# Patient Record
Sex: Female | Born: 1990 | Race: Asian | Hispanic: No | Marital: Married | Smoking: Never smoker
Health system: Southern US, Community
[De-identification: ages and names within clinical notes are randomized; demographics above are authoritative.]

## PROBLEM LIST (undated history)

## (undated) DIAGNOSIS — F32A Depression, unspecified: Secondary | ICD-10-CM

## (undated) DIAGNOSIS — R519 Headache, unspecified: Secondary | ICD-10-CM

## (undated) DIAGNOSIS — F329 Major depressive disorder, single episode, unspecified: Secondary | ICD-10-CM

## (undated) DIAGNOSIS — R87629 Unspecified abnormal cytological findings in specimens from vagina: Secondary | ICD-10-CM

## (undated) DIAGNOSIS — F0781 Postconcussional syndrome: Secondary | ICD-10-CM

## (undated) DIAGNOSIS — G43909 Migraine, unspecified, not intractable, without status migrainosus: Secondary | ICD-10-CM

## (undated) DIAGNOSIS — R51 Headache: Secondary | ICD-10-CM

## (undated) DIAGNOSIS — F419 Anxiety disorder, unspecified: Secondary | ICD-10-CM

## (undated) HISTORY — DX: Headache: R51

## (undated) HISTORY — DX: Migraine, unspecified, not intractable, without status migrainosus: G43.909

## (undated) HISTORY — DX: Depression, unspecified: F32.A

## (undated) HISTORY — DX: Postconcussional syndrome: F07.81

## (undated) HISTORY — DX: Headache, unspecified: R51.9

## (undated) HISTORY — PX: NO PAST SURGERIES: SHX2092

## (undated) HISTORY — DX: Major depressive disorder, single episode, unspecified: F32.9

## (undated) HISTORY — DX: Anxiety disorder, unspecified: F41.9

---
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
## 2013-03-01 ENCOUNTER — Emergency Department (HOSPITAL_COMMUNITY)
Admission: EM | Admit: 2013-03-01 | Discharge: 2013-03-01 | Disposition: A | Discharge status: Home / Self Care | Payer: No Typology Code available for payment source | Attending: Emergency Medicine | Admitting: Emergency Medicine

## 2013-03-01 ENCOUNTER — Encounter (HOSPITAL_COMMUNITY): Payer: Self-pay | Admitting: Emergency Medicine

## 2013-03-01 DIAGNOSIS — S0990XA Unspecified injury of head, initial encounter: Secondary | ICD-10-CM | POA: Insufficient documentation

## 2013-03-01 DIAGNOSIS — Y92410 Unspecified street and highway as the place of occurrence of the external cause: Secondary | ICD-10-CM | POA: Insufficient documentation

## 2013-03-01 DIAGNOSIS — Y9389 Activity, other specified: Secondary | ICD-10-CM | POA: Insufficient documentation

## 2013-03-01 NOTE — ED Provider Notes (Signed)
Medical screening examination/treatment/procedure(s) were performed by non-physician practitioner and as supervising physician I was immediately available for consultation/collaboration.  Issachar Broady T Dalton Mille, MD 03/01/13 1330 
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
## 2013-03-04 ENCOUNTER — Emergency Department (HOSPITAL_COMMUNITY)
Admission: EM | Admit: 2013-03-04 | Discharge: 2013-03-04 | Disposition: A | Discharge status: Home / Self Care | Payer: No Typology Code available for payment source | Attending: Emergency Medicine | Admitting: Emergency Medicine

## 2013-03-04 ENCOUNTER — Encounter (HOSPITAL_COMMUNITY): Payer: Self-pay | Admitting: Emergency Medicine

## 2013-03-04 DIAGNOSIS — F0781 Postconcussional syndrome: Secondary | ICD-10-CM

## 2013-03-04 DIAGNOSIS — R51 Headache: Secondary | ICD-10-CM | POA: Insufficient documentation

## 2013-03-04 DIAGNOSIS — Z3202 Encounter for pregnancy test, result negative: Secondary | ICD-10-CM | POA: Insufficient documentation

## 2013-03-04 LAB — POCT PREGNANCY, URINE: Preg Test, Ur: NEGATIVE

## 2013-03-04 MED ORDER — KETOROLAC TROMETHAMINE 60 MG/2ML IM SOLN
30.0000 mg | Freq: Once | INTRAMUSCULAR | Status: AC
  Administered 2013-03-04: 30 mg via INTRAMUSCULAR
  Filled 2013-03-04: qty 2
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
## 2013-03-18 ENCOUNTER — Emergency Department (HOSPITAL_COMMUNITY)
Admission: EM | Admit: 2013-03-18 | Discharge: 2013-03-18 | Disposition: A | Discharge status: Home / Self Care | Payer: Self-pay | Attending: Emergency Medicine | Admitting: Emergency Medicine

## 2013-03-18 ENCOUNTER — Encounter (HOSPITAL_COMMUNITY): Payer: Self-pay | Admitting: Emergency Medicine

## 2013-03-18 DIAGNOSIS — M545 Low back pain, unspecified: Secondary | ICD-10-CM | POA: Insufficient documentation

## 2013-03-18 DIAGNOSIS — H538 Other visual disturbances: Secondary | ICD-10-CM | POA: Insufficient documentation

## 2013-03-18 DIAGNOSIS — G8911 Acute pain due to trauma: Secondary | ICD-10-CM | POA: Insufficient documentation

## 2013-03-18 DIAGNOSIS — R51 Headache: Secondary | ICD-10-CM | POA: Insufficient documentation

## 2013-03-18 DIAGNOSIS — R42 Dizziness and giddiness: Secondary | ICD-10-CM | POA: Insufficient documentation

## 2013-03-18 MED ORDER — IBUPROFEN 400 MG PO TABS: 400.0000 mg | ORAL_TABLET | Freq: Four times a day (QID) | ORAL | Status: DC | PRN

## 2013-03-18 MED ORDER — METHOCARBAMOL 500 MG PO TABS: 500.0000 mg | ORAL_TABLET | Freq: Two times a day (BID) | ORAL | Status: DC

## 2013-03-18 NOTE — ED Provider Notes (Signed)
Medical screening examination/treatment/procedure(s) were performed by non-physician practitioner and as supervising physician I was immediately available for consultation/collaboration.  EKG Interpretation   None         Allona Gondek M Elania Crowl, DO 03/18/13 1713 
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
## 2013-03-25 ENCOUNTER — Encounter: Payer: Self-pay | Admitting: Neurology

## 2013-03-25 DIAGNOSIS — H538 Other visual disturbances: Secondary | ICD-10-CM

## 2013-03-25 DIAGNOSIS — R42 Dizziness and giddiness: Secondary | ICD-10-CM

## 2013-03-26 ENCOUNTER — Encounter: Payer: Self-pay | Admitting: Neurology

## 2013-03-26 ENCOUNTER — Ambulatory Visit (INDEPENDENT_AMBULATORY_CARE_PROVIDER_SITE_OTHER): Payer: 59 | Admitting: Neurology

## 2013-03-26 VITALS — BP 103/64 | HR 65 | Ht <= 58 in | Wt 102.0 lb

## 2013-03-26 DIAGNOSIS — S060X0A Concussion without loss of consciousness, initial encounter: Secondary | ICD-10-CM | POA: Insufficient documentation
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
## 2013-04-11 ENCOUNTER — Encounter (INDEPENDENT_AMBULATORY_CARE_PROVIDER_SITE_OTHER): Payer: Self-pay

## 2013-04-11 ENCOUNTER — Ambulatory Visit (INDEPENDENT_AMBULATORY_CARE_PROVIDER_SITE_OTHER): Payer: 59

## 2013-04-11 DIAGNOSIS — S060X0A Concussion without loss of consciousness, initial encounter: Secondary | ICD-10-CM

## 2013-04-15 NOTE — Progress Notes (Signed)
Quick Note:  Shared normal MR brain results thru VM message ______

## 2013-05-07 ENCOUNTER — Telehealth: Payer: Self-pay | Admitting: Neurology

## 2013-05-07 NOTE — Telephone Encounter (Signed)
Dana:  I have ready for her, she can pick it up or you can mail it to her,  It is OK to give her a follow up with NP in their next avaialble

## 2013-05-08 NOTE — Telephone Encounter (Signed)
Done patient will pick up at front desk.  

## 2013-07-15 ENCOUNTER — Encounter (HOSPITAL_COMMUNITY): Payer: Self-pay | Admitting: Emergency Medicine

## 2013-07-15 ENCOUNTER — Emergency Department (HOSPITAL_COMMUNITY)
Admission: EM | Admit: 2013-07-15 | Discharge: 2013-07-15 | Disposition: A | Discharge status: Home / Self Care | Payer: PPO | Attending: Emergency Medicine | Admitting: Emergency Medicine

## 2013-07-15 DIAGNOSIS — R42 Dizziness and giddiness: Secondary | ICD-10-CM | POA: Insufficient documentation

## 2013-07-15 DIAGNOSIS — R5381 Other malaise: Secondary | ICD-10-CM | POA: Insufficient documentation

## 2013-07-15 DIAGNOSIS — G43909 Migraine, unspecified, not intractable, without status migrainosus: Secondary | ICD-10-CM | POA: Insufficient documentation

## 2013-07-15 DIAGNOSIS — R0602 Shortness of breath: Secondary | ICD-10-CM | POA: Insufficient documentation

## 2013-07-15 DIAGNOSIS — Z87828 Personal history of other (healed) physical injury and trauma: Secondary | ICD-10-CM | POA: Insufficient documentation

## 2013-07-15 DIAGNOSIS — Z8669 Personal history of other diseases of the nervous system and sense organs: Secondary | ICD-10-CM | POA: Insufficient documentation

## 2013-07-15 DIAGNOSIS — Z72820 Sleep deprivation: Secondary | ICD-10-CM | POA: Insufficient documentation

## 2013-07-15 DIAGNOSIS — R51 Headache: Secondary | ICD-10-CM

## 2013-07-15 DIAGNOSIS — R079 Chest pain, unspecified: Secondary | ICD-10-CM

## 2013-07-15 DIAGNOSIS — R5383 Other fatigue: Secondary | ICD-10-CM

## 2013-07-15 DIAGNOSIS — R519 Headache, unspecified: Secondary | ICD-10-CM

## 2013-07-15 LAB — I-STAT CHEM 8, ED
BUN: 13 mg/dL (ref 6–23)
Calcium, Ion: 1.18 mmol/L (ref 1.12–1.23)
Chloride: 104 mEq/L (ref 96–112)
Creatinine, Ser: 0.9 mg/dL (ref 0.50–1.10)
Glucose, Bld: 97 mg/dL (ref 70–99)
HCT: 41 % (ref 36.0–46.0)
Hemoglobin: 13.9 g/dL (ref 12.0–15.0)
Potassium: 4.4 mEq/L (ref 3.7–5.3)
Sodium: 142 mEq/L (ref 137–147)
TCO2: 24 mmol/L (ref 0–100)

## 2013-07-15 MED ORDER — ASPIRIN-ACETAMINOPHEN-CAFFEINE 250-250-65 MG PO TABS: 1.0000 | ORAL_TABLET | Freq: Once | ORAL | Status: DC

## 2013-07-15 MED ORDER — ASPIRIN-ACETAMINOPHEN-CAFFEINE 250-250-65 MG PO TABS: ORAL_TABLET | ORAL | Status: DC

## 2013-07-15 MED ORDER — ACETAMINOPHEN 500 MG PO TABS
1000.0000 mg | ORAL_TABLET | Freq: Once | ORAL | Status: AC
  Administered 2013-07-15: 1000 mg via ORAL
  Filled 2013-07-15: qty 2

## 2013-07-15 MED ORDER — HYDROCODONE-ACETAMINOPHEN 5-325 MG PO TABS
2.0000 | ORAL_TABLET | Freq: Once | ORAL | Status: AC
  Administered 2013-07-15: 2 via ORAL
  Filled 2013-07-15: qty 2

## 2013-07-15 NOTE — Discharge Instructions (Signed)
Be sure to get at least 8 hours of sleep a night.  It may be helpful to speak with an academic advisor or counselor to help with your academic load. There might be areas where you can cut back to allow yourself more time to complete assignments while not missing out on as much sleep. Remember to also eat at least 3 meals a day.  This too will help with your overall energy and health.  If symptoms do not improve with increased sleep and decreased stress, follow up with your primary care provider.  Return to ER if you develop continued chest pain, trouble breathing or any NEW or worsening symptoms.
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
## 2013-07-15 NOTE — ED Notes (Signed)
Gave pt ginger ale.  

## 2013-07-16 NOTE — ED Provider Notes (Signed)
Medical screening examination/treatment/procedure(s) were conducted as a shared visit with non-physician practitioner(s) and myself.  I personally evaluated the patient during the encounter.   EKG Interpretation   Date/Time:  Monday July 15 2013 10:29:02 EDT Ventricular Rate:  58 PR Interval:  120 QRS Duration: 87 QT Interval:  418 QTC Calculation: 410 R Axis:   86 Text Interpretation:  Sinus rhythm ST elev, probable normal early repol  pattern Confirmed by Ramces Shomaker  MD, Caryn BeeKEVIN ([id number]) on 07/15/2013 10:33:08 AM      Pt with recent sleep dep, c/o intermittent frontal headache. No eye pain. No neck pain or stiffness. No fever or chills. Non focal neuro exam. Pt currently feels improved.   Suzi RootsKevin E Lauranne Beyersdorf, MD 07/16/13 1134

## 2013-07-24 ENCOUNTER — Encounter: Payer: Self-pay | Admitting: Neurology

## 2013-07-24 ENCOUNTER — Ambulatory Visit (INDEPENDENT_AMBULATORY_CARE_PROVIDER_SITE_OTHER): Payer: PPO | Admitting: Neurology

## 2013-07-24 VITALS — BP 100/60 | HR 75 | Temp 97.7°F | Ht 59.0 in | Wt 101.0 lb

## 2013-07-24 DIAGNOSIS — R519 Headache, unspecified: Secondary | ICD-10-CM | POA: Insufficient documentation

## 2013-07-24 DIAGNOSIS — R51 Headache: Secondary | ICD-10-CM

## 2013-07-24 DIAGNOSIS — M531 Cervicobrachial syndrome: Secondary | ICD-10-CM

## 2013-07-24 DIAGNOSIS — M5481 Occipital neuralgia: Secondary | ICD-10-CM

## 2013-07-24 MED ORDER — NORTRIPTYLINE HCL 10 MG PO CAPS
ORAL_CAPSULE | ORAL | Status: DC
Start: 2013-07-24 — End: 2013-07-31

## 2013-07-24 MED ORDER — PREDNISONE 10 MG PO TABS: ORAL_TABLET | ORAL | Status: DC

## 2013-07-24 MED ORDER — FLURBIPROFEN 50 MG PO TABS: ORAL_TABLET | ORAL | Status: DC

## 2013-07-24 NOTE — Patient Instructions (Signed)
1. Start nortriptyline 10mg  caps: Take 1 cap at bedtime for 1 week, then increase to 2 caps at bedtime 2. Start Prednisone 10mg : Take 6 tabs on day 1, 5 tabs on day 2, 4 tabs on day 3, 3 tabs on day 4, 2 tabs on days 5 and 6, 1 tab on days 7 and 8 3. Take Ansaid 50mg  twice a day as needed for rescue for the headaches. Do not take more than 3 times a week. 4. Follow-up in 6 weeks
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
## 2013-07-30 ENCOUNTER — Encounter: Payer: Self-pay | Admitting: Neurology

## 2013-07-31 ENCOUNTER — Ambulatory Visit (INDEPENDENT_AMBULATORY_CARE_PROVIDER_SITE_OTHER): Payer: PPO | Admitting: Neurology

## 2013-07-31 ENCOUNTER — Encounter (HOSPITAL_COMMUNITY): Payer: Self-pay | Admitting: Emergency Medicine

## 2013-07-31 ENCOUNTER — Emergency Department (HOSPITAL_COMMUNITY): Payer: PPO

## 2013-07-31 ENCOUNTER — Encounter: Payer: Self-pay | Admitting: Neurology

## 2013-07-31 ENCOUNTER — Emergency Department (HOSPITAL_COMMUNITY)
Admission: EM | Admit: 2013-07-31 | Discharge: 2013-07-31 | Disposition: A | Discharge status: Home / Self Care | Payer: PPO | Attending: Emergency Medicine | Admitting: Emergency Medicine

## 2013-07-31 VITALS — BP 120/76 | HR 69 | Temp 98.8°F | Wt 99.9 lb

## 2013-07-31 DIAGNOSIS — Z79899 Other long term (current) drug therapy: Secondary | ICD-10-CM | POA: Insufficient documentation

## 2013-07-31 DIAGNOSIS — R1013 Epigastric pain: Secondary | ICD-10-CM | POA: Insufficient documentation

## 2013-07-31 DIAGNOSIS — G43909 Migraine, unspecified, not intractable, without status migrainosus: Secondary | ICD-10-CM | POA: Insufficient documentation

## 2013-07-31 DIAGNOSIS — IMO0002 Reserved for concepts with insufficient information to code with codable children: Secondary | ICD-10-CM | POA: Insufficient documentation

## 2013-07-31 DIAGNOSIS — R079 Chest pain, unspecified: Secondary | ICD-10-CM

## 2013-07-31 LAB — COMPREHENSIVE METABOLIC PANEL
ALT: 18 U/L (ref 0–35)
AST: 22 U/L (ref 0–37)
Albumin: 4.2 g/dL (ref 3.5–5.2)
Alkaline Phosphatase: 47 U/L (ref 39–117)
BILIRUBIN TOTAL: 0.3 mg/dL (ref 0.3–1.2)
BUN: 14 mg/dL (ref 6–23)
CHLORIDE: 100 meq/L (ref 96–112)
CO2: 24 meq/L (ref 19–32)
CREATININE: 0.64 mg/dL (ref 0.50–1.10)
Calcium: 9.5 mg/dL (ref 8.4–10.5)
GFR calc Af Amer: >90 mL/min (ref >90)
Glucose, Bld: 96 mg/dL (ref 70–99)
Potassium: 4.1 mEq/L (ref 3.7–5.3)
Sodium: 136 mEq/L — ABNORMAL LOW (ref 137–147)
Total Protein: 8.1 g/dL (ref 6.0–8.3)

## 2013-07-31 LAB — CBC WITH DIFFERENTIAL/PLATELET
Basophils Absolute: 0 10*3/uL (ref 0.0–0.1)
Basophils Relative: 0 % (ref 0–1)
Eosinophils Absolute: 0 10*3/uL (ref 0.0–0.7)
Eosinophils Relative: 0 % (ref 0–5)
HCT: 37.6 % (ref 36.0–46.0)
Hemoglobin: 12.2 g/dL (ref 12.0–15.0)
LYMPHS ABS: 0.9 10*3/uL (ref 0.7–4.0)
LYMPHS PCT: 7 % — AB (ref 12–46)
MCH: 22.8 pg — ABNORMAL LOW (ref 26.0–34.0)
MCHC: 32.4 g/dL (ref 30.0–36.0)
MCV: 70.1 fL — AB (ref 78.0–100.0)
MONOS PCT: 1 % — AB (ref 3–12)
Monocytes Absolute: 0.1 10*3/uL (ref 0.1–1.0)
Neutro Abs: 11.9 10*3/uL — ABNORMAL HIGH (ref 1.7–7.7)
Neutrophils Relative %: 92 % — ABNORMAL HIGH (ref 43–77)
PLATELETS: 344 10*3/uL (ref 150–400)
RBC: 5.36 MIL/uL — ABNORMAL HIGH (ref 3.87–5.11)
RDW: 13.9 % (ref 11.5–15.5)
WBC: 12.9 10*3/uL — ABNORMAL HIGH (ref 4.0–10.5)

## 2013-07-31 LAB — LIPASE, BLOOD: LIPASE: 27 U/L (ref 11–59)

## 2013-07-31 MED ORDER — OMEPRAZOLE 20 MG PO CPDR: 20.0000 mg | DELAYED_RELEASE_CAPSULE | Freq: Every day | ORAL | Status: DC

## 2013-07-31 MED ORDER — GI COCKTAIL ~~LOC~~
30.0000 mL | Freq: Once | ORAL | Status: AC
  Administered 2013-07-31: 30 mL via ORAL
  Filled 2013-07-31: qty 30

## 2013-07-31 MED ORDER — FAMOTIDINE 20 MG PO TABS: 20.0000 mg | ORAL_TABLET | Freq: Two times a day (BID) | ORAL | Status: DC

## 2013-07-31 MED ORDER — SUCRALFATE 1 G PO TABS: 1.0000 g | ORAL_TABLET | Freq: Three times a day (TID) | ORAL | Status: DC

## 2013-07-31 MED ORDER — FAMOTIDINE 20 MG PO TABS
20.0000 mg | ORAL_TABLET | Freq: Once | ORAL | Status: AC
  Administered 2013-07-31: 20 mg via ORAL
  Filled 2013-07-31: qty 1

## 2013-07-31 NOTE — ED Notes (Signed)
Pt experiences epigastric pain. States that it comes and goes.

## 2013-07-31 NOTE — Progress Notes (Signed)
Patient was seen last week for headaches, started on nortriptyline, tolerating medication.  Yesterday she started having pain in the middle of her chest, and everytime she moves it hurst.  It is tender to touch, with a burning sensation on the right epigastric region.  It is painful when she breathes. I briefly examined patient, lungs are clear, cardiac RRR, there is tenderness to palpation over the right lower chest wall.  Likely musculoskeletal.   Discussed with patient that this is a neurology clinic, we are unable to do tests for chest pain, she needs to go to ER for this and speak with her primary care physician as well.   Patient will proceed to ER.

## 2013-07-31 NOTE — ED Notes (Signed)
Pt in c/o chest wall pain, pain is reproducible with movement, palpation and deep inspiration, seen for same a few weeks ago with a normal exam, pt states pain returned yesterday, points to lower sternal area when describing pain, grimaced when applying electrodes for EKG. No other distress noted, denies other symptoms.

## 2013-07-31 NOTE — ED Provider Notes (Signed)
CSN: [id number]     Arrival date & time 07/31/13  1533 History   First MD Initiated Contact with Patient 07/31/13 1751     Chief Complaint  Patient presents with  . Chest wall pain      (Consider location/radiation/quality/duration/timing/severity/associated sxs/prior Treatment) HPI Grace Torres is a 23 y.o. female who presents emergency department complaining of epigastric abdominal pain. Patient states the pain has been there for several weeks. States his is sharp, worsening. States worsened with movement palpation and deep inspiration. Patient states she's currently under a lot of stress. 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 She has not tried taking any medications for this. She denies any shortness of breath, nausea, diaphoresis. She denies any vomiting. She denies back pain. No urinary symptoms. She's not pregnant. She was seen for the same he weeks ago, and was discharged home with normal test results. She did not followup.    Past Medical History  Diagnosis Date  . Dizziness   . Blurred vision   . Migraines    History reviewed. No pertinent past surgical history. History reviewed. No pertinent family history. History  Substance Use Topics  . Smoking status: Never Smoker   . Smokeless tobacco: Never Used  . Alcohol Use: No   OB History   Grav Para Term Preterm Abortions TAB SAB Ect Mult Living                 Review of Systems  Constitutional: Negative for fever and chills.  Respiratory: Negative for cough, chest tightness and shortness of breath.   Cardiovascular: Positive for chest pain. Negative for palpitations and leg swelling.  Gastrointestinal: Positive for abdominal pain. Negative for nausea, vomiting and diarrhea.  Genitourinary: Negative for dysuria, flank pain and pelvic pain.  Musculoskeletal: Negative for arthralgias, myalgias, neck pain and neck stiffness.  Skin: Negative for rash.  Neurological:  Negative for dizziness, weakness and headaches.  All other systems reviewed and are negative.      Allergies  Review of patient's allergies indicates no known allergies.  Home Medications   Current Outpatient Rx  Name  Route  Sig  Dispense  Refill  . flurbiprofen (ANSAID) 50 MG tablet   Oral   Take 50 mg by mouth 2 (two) times daily as needed (for pain).         Marland Kitchen ibuprofen (ADVIL,MOTRIN) 200 MG tablet   Oral   Take 800 mg by mouth every 6 (six) hours as needed for moderate pain.         . nortriptyline (PAMELOR) 10 MG capsule   Oral   Take 20 mg by mouth at bedtime.         . predniSONE (DELTASONE) 10 MG tablet   Oral   Take 10-60 mg by mouth daily with breakfast. Takes 60mg  day 1, 50mg  day 2, 40mg  day 3, 30mg  day 4, 20mg  day 5, 10mg  day 6, then stop          BP 121/79  Pulse 79  Temp(Src) 97.9 F (36.6 C) (Oral)  Resp 20  SpO2 100%  LMP 07/09/2013 Physical Exam  Nursing note and vitals reviewed. Constitutional: She appears well-developed and well-nourished. No distress.  HENT:  Head: Normocephalic.  Eyes: Conjunctivae are normal.  Neck: Neck supple.  Cardiovascular: Normal rate, regular rhythm and normal heart sounds.   Pulmonary/Chest: Effort normal and breath sounds normal. No respiratory distress. She has no wheezes.  She has no rales.  Abdominal: Soft. Bowel sounds are normal. She exhibits no distension. There is tenderness. There is no rebound.  Epigastric tenderness  Musculoskeletal: She exhibits no edema.  Neurological: She is alert.  Skin: Skin is warm and dry.  Psychiatric: She has a normal mood and affect. Her behavior is normal.    ED Course  Procedures (including critical care time) Labs Review Labs Reviewed  CBC WITH DIFFERENTIAL  COMPREHENSIVE METABOLIC PANEL  LIPASE, BLOOD  CBC WITH DIFFERENTIAL   Imaging Review Dg Chest 2 View  07/31/2013   CLINICAL DATA:  Recurrent right-sided chest pain.  Headache.  EXAM: CHEST  2 VIEW   COMPARISON:  None.  FINDINGS: Heart size is normal. Mediastinal shadows are normal. The lungs are clear. No effusions. Minimal spinal curvature convex to the right.  IMPRESSION: No active cardiopulmonary disease. No cause of the described symptoms is identified.   Electronically Signed   By: Paulina FusiMark  Shogry M.D.   On: 07/31/2013 16:17     EKG Interpretation None      MDM   Final diagnoses:  Epigastric abdominal pain    Pt with epigastric pain worsened over last several days. Pt is under a lot of stress. Has been taking large amount of ibuprofen for this pain. She is not sleeping, getting about 2hrs of sleep a night. Will get labs, cxr.   8:15 PM Elevated wbc. Abdomen is soft, no guarding. Doubt surgical abdomen. Suspect most likely gastritis vs ulcers. Wills tart on H2 blocker, carafate, PPI, follow up with gi. Pt felt some improvement with gi cocktail. Return precautions given.   Filed Vitals:   07/31/13 1800 07/31/13 1830 07/31/13 1931 07/31/13 2000  BP: 134/81 121/79 127/110 114/78  Pulse:   70 68  Temp:      TempSrc:      Resp:      SpO2:   100% 100%       Myriam Jacobsonatyana A Kitai Purdom, PA-C 07/31/13 2016

## 2013-07-31 NOTE — ED Provider Notes (Signed)
Medical screening examination/treatment/procedure(s) were performed by non-physician practitioner and as supervising physician I was immediately available for consultation/collaboration.   EKG Interpretation None      Devoria AlbeIva Salene Mohamud, MD, Armando GangFACEP   Ward GivensIva L Tjay Velazquez, MD 07/31/13 2026

## 2013-07-31 NOTE — Discharge Instructions (Signed)
Stop drinking alcohol, do not take ibuprofen, naprosyn, aleve.

## 2013-08-15 ENCOUNTER — Other Ambulatory Visit: Payer: Self-pay | Admitting: Gastroenterology

## 2013-08-15 ENCOUNTER — Ambulatory Visit
Admission: RE | Admit: 2013-08-15 | Discharge: 2013-08-15 | Disposition: A | Discharge status: Home / Self Care | Payer: PPO | Source: Ambulatory Visit | Attending: Gastroenterology | Admitting: Gastroenterology

## 2013-08-15 DIAGNOSIS — R1031 Right lower quadrant pain: Secondary | ICD-10-CM

## 2013-08-15 DIAGNOSIS — R1013 Epigastric pain: Secondary | ICD-10-CM

## 2013-08-15 MED ORDER — IOHEXOL 300 MG/ML  SOLN
30.0000 mL | Freq: Once | INTRAMUSCULAR | Status: AC | PRN
  Administered 2013-08-15: 30 mL via ORAL

## 2013-08-15 MED ORDER — IOHEXOL 300 MG/ML  SOLN
100.0000 mL | Freq: Once | INTRAMUSCULAR | Status: AC | PRN
  Administered 2013-08-15: 100 mL via INTRAVENOUS

## 2013-09-27 ENCOUNTER — Encounter: Payer: Self-pay | Admitting: Neurology

## 2013-09-27 ENCOUNTER — Ambulatory Visit (INDEPENDENT_AMBULATORY_CARE_PROVIDER_SITE_OTHER): Payer: PPO | Admitting: Neurology

## 2013-09-27 VITALS — BP 100/76 | HR 101 | Ht 59.0 in | Wt 102.2 lb

## 2013-09-27 DIAGNOSIS — R51 Headache: Secondary | ICD-10-CM

## 2013-09-27 MED ORDER — TOPIRAMATE 25 MG PO TABS: 25.0000 mg | ORAL_TABLET | Freq: Every day | ORAL | Status: DC
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
## 2013-09-27 NOTE — Patient Instructions (Signed)
1. Start Topamax 25mg : 1 tab at bedtime 2. It is important to get good sleep, regular exercise to help with headaches 3. Follow-up in 4 months

## 2013-10-16 ENCOUNTER — Telehealth: Payer: Self-pay | Admitting: Neurology

## 2013-10-16 NOTE — Telephone Encounter (Signed)
Patient was advised to increased one tablet at bed time

## 2013-10-16 NOTE — Telephone Encounter (Signed)
Patient states that every since she started the new medication she is still have headaches and dizziness. Patient also complaining of fatigue daily.

## 2013-10-16 NOTE — Telephone Encounter (Signed)
Pt having concerns with meds. Says her migraines are worsening. CB# [phone] / Sherri S.

## 2013-10-16 NOTE — Telephone Encounter (Signed)
Increase Topamax to 25mg  2 tabs qhs. Thanks

## 2014-08-13 ENCOUNTER — Encounter: Payer: Self-pay | Admitting: Neurology

## 2014-08-13 ENCOUNTER — Ambulatory Visit (INDEPENDENT_AMBULATORY_CARE_PROVIDER_SITE_OTHER): Payer: PPO | Admitting: Neurology

## 2014-08-13 VITALS — BP 100/70 | HR 76 | Resp 16 | Ht 59.0 in | Wt 97.0 lb

## 2014-08-13 DIAGNOSIS — F329 Major depressive disorder, single episode, unspecified: Secondary | ICD-10-CM | POA: Diagnosis not present

## 2014-08-13 DIAGNOSIS — F32A Depression, unspecified: Secondary | ICD-10-CM

## 2014-08-13 DIAGNOSIS — R51 Headache: Secondary | ICD-10-CM | POA: Diagnosis not present

## 2014-08-13 DIAGNOSIS — M542 Cervicalgia: Secondary | ICD-10-CM | POA: Insufficient documentation

## 2014-08-13 DIAGNOSIS — R519 Headache, unspecified: Secondary | ICD-10-CM | POA: Insufficient documentation

## 2014-08-13 LAB — CK: CK TOTAL: 49 U/L (ref 7–177)

## 2014-08-13 LAB — RHEUMATOID FACTOR: Rhuematoid fact SerPl-aCnc: <10 IU/mL (ref <=14)

## 2014-08-13 MED ORDER — VENLAFAXINE HCL 37.5 MG PO TABS: ORAL_TABLET | ORAL | Status: DC

## 2014-08-13 MED ORDER — TIZANIDINE HCL 2 MG PO TABS: ORAL_TABLET | ORAL | Status: DC

## 2014-08-13 NOTE — Patient Instructions (Addendum)
1. Start Effexor 37.6m tablet: Take 1 tablet every other day for 1 week, then increase to 1 tablet daily 2. Take Tizanidine 27mtablet at bedtime as needed for neck pain, muscle spasms 3. Bloodwork for ESR, ANA, RF, CK 4. Refer to Psychology for depression 5. Follow-up in 2 months
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
## 2014-08-14 LAB — SEDIMENTATION RATE: Sed Rate: 6 mm/hr (ref 0–20)

## 2014-08-15 ENCOUNTER — Ambulatory Visit (INDEPENDENT_AMBULATORY_CARE_PROVIDER_SITE_OTHER): Payer: PPO | Admitting: Family Medicine

## 2014-08-15 ENCOUNTER — Encounter: Payer: Self-pay | Admitting: Neurology

## 2014-08-15 ENCOUNTER — Encounter: Payer: Self-pay | Admitting: Family Medicine

## 2014-08-15 VITALS — BP 102/78 | HR 55 | Temp 98.6°F | Ht 60.0 in | Wt 95.8 lb

## 2014-08-15 DIAGNOSIS — F39 Unspecified mood [affective] disorder: Secondary | ICD-10-CM

## 2014-08-15 DIAGNOSIS — F329 Major depressive disorder, single episode, unspecified: Secondary | ICD-10-CM | POA: Insufficient documentation

## 2014-08-15 DIAGNOSIS — F32A Depression, unspecified: Secondary | ICD-10-CM | POA: Insufficient documentation

## 2014-08-15 DIAGNOSIS — Z8669 Personal history of other diseases of the nervous system and sense organs: Secondary | ICD-10-CM

## 2014-08-15 DIAGNOSIS — M542 Cervicalgia: Secondary | ICD-10-CM | POA: Diagnosis not present

## 2014-08-15 LAB — ANTI-NUCLEAR AB-TITER (ANA TITER): ANA Titer 1: 1:80 {titer} — ABNORMAL HIGH

## 2014-08-15 LAB — ANA: Anti Nuclear Antibody(ANA): POSITIVE — AB
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
## 2014-08-15 NOTE — Progress Notes (Signed)
Pre visit review using our clinic review tool, if applicable. No additional management support is needed unless otherwise documented below in the visit note. 

## 2014-08-15 NOTE — Patient Instructions (Addendum)
BEFORE YOU LEAVE: -schedule physical exam in abut 3 months -neck strain exercises  Please schedule an appointment with Dr. Jason FilaBray to help with the anxiety, depression and the chronic pain  We recommend the following healthy lifestyle measures: - eat a healthy diet consisting of lots of vegetables, fruits, beans, nuts, seeds, healthy meats such as white chicken and fish and whole grains.  - avoid fried foods, fast food, processed foods, sodas, red meet and other fattening foods.  - get a least 150 minutes of aerobic exercise per week.   Please schedule an eye exam with an eye doctor

## 2014-08-18 ENCOUNTER — Telehealth: Payer: Self-pay | Admitting: Neurology

## 2014-08-18 ENCOUNTER — Encounter: Payer: Self-pay | Admitting: Family Medicine

## 2014-08-18 ENCOUNTER — Encounter: Payer: Self-pay | Admitting: *Deleted

## 2014-08-18 ENCOUNTER — Telehealth: Payer: Self-pay | Admitting: Family Medicine

## 2014-08-18 ENCOUNTER — Ambulatory Visit (INDEPENDENT_AMBULATORY_CARE_PROVIDER_SITE_OTHER): Payer: PPO | Admitting: Family Medicine

## 2014-08-18 VITALS — BP 98/68 | HR 72 | Temp 97.9°F | Ht 60.0 in | Wt 97.5 lb

## 2014-08-18 DIAGNOSIS — R51 Headache: Secondary | ICD-10-CM | POA: Diagnosis not present

## 2014-08-18 DIAGNOSIS — F32A Depression, unspecified: Secondary | ICD-10-CM

## 2014-08-18 DIAGNOSIS — M542 Cervicalgia: Secondary | ICD-10-CM | POA: Diagnosis not present

## 2014-08-18 DIAGNOSIS — R519 Headache, unspecified: Secondary | ICD-10-CM

## 2014-08-18 DIAGNOSIS — F329 Major depressive disorder, single episode, unspecified: Secondary | ICD-10-CM

## 2014-08-18 MED ORDER — ONDANSETRON HCL 4 MG PO TABS: 2.0000 mg | ORAL_TABLET | Freq: Three times a day (TID) | ORAL | Status: DC | PRN

## 2014-08-18 NOTE — Telephone Encounter (Signed)
Spoke with patient, I did advise her of below. There was nothing else that Dr. Karel JarvisAquino would suggest medication wise at this time.

## 2014-08-18 NOTE — Progress Notes (Signed)
Pre visit review using our clinic review tool, if applicable. No additional management support is needed unless otherwise documented below in the visit note. 
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
## 2014-08-18 NOTE — Telephone Encounter (Signed)
She states that the Effexor 37.5 mg isn't helping there has been no change with headache. She says that some days headaches have been worse. Please advise.

## 2014-08-18 NOTE — Telephone Encounter (Signed)
Pls let her know the medication does not work instantaneously, she has to give it time to work. We only call a medication a failure if she has been taking an adequate dose for 2 months. She is only on a low dose, we can still increase this, but would give it time.

## 2014-08-18 NOTE — Telephone Encounter (Signed)
I called the pt and advised her per Dr Selena BattenKim she will be glad to see her today; however it can take some time for the Venlafaxine to work and she has only been on this for a few days and she agreed.

## 2014-08-18 NOTE — Patient Instructions (Signed)
Try topical menthol (tiger balm), aleve (try to limit for your bad headaches and not more the 2 times per week) and 1/2 tablet of zofran if headache with nausea.  Follow up with your neurologist regarding these symptoms if not improving or worsening.  Call today to schedule appointment with Dr. Jason FilaBray

## 2014-08-18 NOTE — Telephone Encounter (Signed)
Patient called stating medication prescribed by Dr. Karel JarvisAquino isn't helping and she is feeling worse.  I tried to explain to her that she needed to follow up with Dr. Karel JarvisAquino for that.  She insisted that they did not have any openings and needed to see Dr. Selena BattenKim for other reasons.  I asked the other reasons and was told "headache and neck pain".

## 2014-08-18 NOTE — Telephone Encounter (Signed)
Pt states that the medication is not working and would like to talk to someone please call [phone]

## 2014-08-22 ENCOUNTER — Telehealth: Payer: Self-pay | Admitting: Neurology

## 2014-08-22 DIAGNOSIS — M791 Myalgia, unspecified site: Secondary | ICD-10-CM

## 2014-08-22 DIAGNOSIS — M2550 Pain in unspecified joint: Secondary | ICD-10-CM

## 2014-08-22 DIAGNOSIS — R768 Other specified abnormal immunological findings in serum: Secondary | ICD-10-CM

## 2014-08-22 DIAGNOSIS — R7689 Other specified abnormal immunological findings in serum: Secondary | ICD-10-CM

## 2014-08-22 NOTE — Telephone Encounter (Signed)
Pt requesting a note or letter from Dr. Karel JarvisAquino to her school professor advising she is under our care. She is trying to get an extension on a research paper and the teacher has requested a note from us. Please call patient at [phone] / Oneita KrasSherri S.

## 2014-08-22 NOTE — Telephone Encounter (Signed)
Please review

## 2014-08-22 NOTE — Telephone Encounter (Signed)
Spoke to patient, needs letter stating that she is seeing a neurologist, what she is going through, she is asking for extension. Professor is Grace Torres, Grace Torres.   Also discussed mildly elevated ANA. In the setting of her muscle/joint aches and positive ANA, after discussion with her PCP Dr. Selena BattenKim, we both agree to have a Rheumatology evaluation.   Tiff, pls refer to Rheumatology for positive ANA and joint/muscle pain. Thanks

## 2014-08-22 NOTE — Telephone Encounter (Signed)
Referral faxed to Dr. Kellie Simmeringruslow.  Ph [phone]  Fax [phone]

## 2014-08-25 ENCOUNTER — Telehealth: Payer: Self-pay | Admitting: *Deleted

## 2014-08-25 NOTE — Telephone Encounter (Signed)
Patient came in to the office stating she wanted a note from Dr Selena BattenKim stating her overall picture of her problems due to her asking for an extension on her classes.  I advised the pt per Dr Selena BattenKim this would be better to come from her neurologist as they are treating her problems.  She spoke with Mrs Claris CheMargaret and filled out a request form for her medical records.

## 2014-08-26 ENCOUNTER — Encounter: Payer: Self-pay | Admitting: Neurology

## 2014-08-26 ENCOUNTER — Telehealth: Payer: Self-pay | Admitting: Neurology

## 2014-08-26 NOTE — Telephone Encounter (Signed)
She states the Effexor is still making her sick, states she's is actually getting worse. She states when she takes Aleve it works "much" better for her. She also wanted to know the status of her letter for school.

## 2014-08-26 NOTE — Telephone Encounter (Signed)
Pt needs to know the status of forms she talked to Dr Karel JarvisAquino about please call [phone] plus patient needs to talk to some one about medication making her sick

## 2014-08-27 ENCOUNTER — Telehealth: Payer: Self-pay | Admitting: Family Medicine

## 2014-08-27 NOTE — Telephone Encounter (Signed)
Pt state that the the dr we referred her to has not call please call her at [phone]

## 2014-08-27 NOTE — Telephone Encounter (Signed)
Left msg on voicemail that letter she requested for school is ready for pick up, left at front desk.

## 2014-08-27 NOTE — Telephone Encounter (Signed)
Returned call. Left msg letting patient know that I am still working on her referral, the 1st doctor that we referred her to couldn't see her. I am working on getting her referred to someone else. I did let her know that these referrals do take a little longer sometimes because we have to send her records 1st for the doctor to review and then at that time the doctor will decide if they can see the patient. Once referral has been approved by the physician she will get a call about an appt. Asked her to call me if she had any further questions.

## 2014-08-29 ENCOUNTER — Telehealth: Payer: Self-pay | Admitting: Family Medicine

## 2014-08-29 NOTE — Telephone Encounter (Signed)
[facility]. Patient has been scheduled to Dr. Beekman/Rheumatology on 10/14/14 @ 9:00 am.   I did speak with patient she is aware of appt.

## 2014-10-06 ENCOUNTER — Encounter: Payer: Self-pay | Admitting: Neurology

## 2014-10-06 ENCOUNTER — Ambulatory Visit (INDEPENDENT_AMBULATORY_CARE_PROVIDER_SITE_OTHER): Payer: PPO | Admitting: Neurology

## 2014-10-06 VITALS — BP 100/60 | HR 68 | Resp 14 | Ht 60.0 in | Wt 97.7 lb

## 2014-10-06 DIAGNOSIS — M542 Cervicalgia: Secondary | ICD-10-CM

## 2014-10-06 DIAGNOSIS — G4486 Cervicogenic headache: Secondary | ICD-10-CM | POA: Insufficient documentation

## 2014-10-06 DIAGNOSIS — R51 Headache: Secondary | ICD-10-CM
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
## 2014-10-06 NOTE — Patient Instructions (Signed)
1. Refer to Dr. Antoine PrimasZachary Smith for cervicogenic headaches 2. Follow-up with Rheumatology as scheduled 3. Follow-up in 4-5 months, call our office for any changes

## 2014-10-21 ENCOUNTER — Ambulatory Visit (INDEPENDENT_AMBULATORY_CARE_PROVIDER_SITE_OTHER): Payer: PPO | Admitting: Family Medicine

## 2014-10-21 ENCOUNTER — Encounter: Payer: Self-pay | Admitting: Family Medicine

## 2014-10-21 VITALS — BP 102/64 | HR 82 | Ht 61.0 in | Wt 98.0 lb

## 2014-10-21 DIAGNOSIS — M9908 Segmental and somatic dysfunction of rib cage: Secondary | ICD-10-CM | POA: Diagnosis not present

## 2014-10-21 DIAGNOSIS — M9901 Segmental and somatic dysfunction of cervical region: Secondary | ICD-10-CM | POA: Diagnosis not present

## 2014-10-21 DIAGNOSIS — R51 Headache: Secondary | ICD-10-CM | POA: Diagnosis not present

## 2014-10-21 DIAGNOSIS — M9902 Segmental and somatic dysfunction of thoracic region: Secondary | ICD-10-CM | POA: Diagnosis not present

## 2014-10-21 DIAGNOSIS — G4486 Cervicogenic headache: Secondary | ICD-10-CM

## 2014-10-21 DIAGNOSIS — M999 Biomechanical lesion, unspecified: Secondary | ICD-10-CM | POA: Insufficient documentation

## 2014-10-21 NOTE — Progress Notes (Signed)
Pre visit review using our clinic review tool, if applicable. No additional management support is needed unless otherwise documented below in the visit note. 
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
## 2014-10-21 NOTE — Assessment & Plan Note (Signed)
Decision today to treat with OMT was based on Physical Exam  After verbal consent patient was treated with ME, FPR, ST techniques in cervical, thoracic and rib areas  Patient tolerated the procedure well with improvement in symptoms  Patient given exercises, stretches and lifestyle modifications  See medications in patient instructions if given  Patient will follow up in 3 weeks        

## 2014-10-21 NOTE — Assessment & Plan Note (Signed)
Patient cervical June candidate because secondary to more of a muscle imbalances. Patient did do think puts a lot of stress on her some physical. We discussed postural changes, home exercises, patient given a trial topical anti-inflammatory's and we discussed over-the-counter natural supplementations a could be beneficial as well. Patient is going to continue to remain active. Patient will consider watching her diet as well to make sure she is getting proper hydration. Patient and will come back and see me again in 3 weeks for further evaluation and treatment.

## 2014-10-21 NOTE — Patient Instructions (Signed)
Good to see you Fish oil 2 grams daily Turmeric 500mg  twice daily Choline 500mg  daily Monitor at eye level Textbooks on music stand Tennisball between shoulder blades with sitting a long amount of time 2 tennisballs in a tube sock and lay on them where head meets neck to help with headaches Stand on a wall with heels, butt shoulder and head touching for goal of 5 minutes a day See me again in 3 weeks.

## 2014-11-04 ENCOUNTER — Ambulatory Visit (INDEPENDENT_AMBULATORY_CARE_PROVIDER_SITE_OTHER): Payer: PPO | Admitting: Family Medicine

## 2014-11-04 ENCOUNTER — Encounter: Payer: Self-pay | Admitting: Family Medicine

## 2014-11-04 VITALS — BP 102/76 | HR 117 | Temp 98.0°F | Ht 61.0 in | Wt 98.7 lb

## 2014-11-04 DIAGNOSIS — M542 Cervicalgia: Secondary | ICD-10-CM | POA: Diagnosis not present

## 2014-11-04 DIAGNOSIS — R51 Headache: Secondary | ICD-10-CM

## 2014-11-04 DIAGNOSIS — B349 Viral infection, unspecified: Secondary | ICD-10-CM | POA: Diagnosis not present

## 2014-11-04 DIAGNOSIS — R519 Headache, unspecified: Secondary | ICD-10-CM

## 2014-11-04 NOTE — Progress Notes (Signed)
Pre visit review using our clinic review tool, if applicable. No additional management support is needed unless otherwise documented below in the visit note. 
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
## 2014-11-04 NOTE — Patient Instructions (Signed)
Tylenol 500-1000mg  up to 3 times per day;  1-2 pills, no more then twice daily  Gentle exercise  Plenty of fluids  Follow up with your rheumatologist if body pain persists for further discussion of treatment options for fibromyalgia

## 2014-11-07 ENCOUNTER — Telehealth: Payer: Self-pay | Admitting: Family Medicine

## 2014-11-07 NOTE — Telephone Encounter (Signed)
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 Will be left at the front desk she can come by anytime within our office hours.

## 2014-11-11 ENCOUNTER — Encounter: Payer: Self-pay | Admitting: Family Medicine

## 2014-11-11 ENCOUNTER — Ambulatory Visit (INDEPENDENT_AMBULATORY_CARE_PROVIDER_SITE_OTHER): Payer: PPO | Admitting: Family Medicine

## 2014-11-11 VITALS — BP 110/72 | HR 91 | Ht 61.0 in | Wt 96.0 lb

## 2014-11-11 DIAGNOSIS — M9901 Segmental and somatic dysfunction of cervical region: Secondary | ICD-10-CM | POA: Diagnosis not present

## 2014-11-11 DIAGNOSIS — M999 Biomechanical lesion, unspecified: Secondary | ICD-10-CM

## 2014-11-11 DIAGNOSIS — M9908 Segmental and somatic dysfunction of rib cage: Secondary | ICD-10-CM | POA: Diagnosis not present

## 2014-11-11 DIAGNOSIS — G4486 Cervicogenic headache: Secondary | ICD-10-CM

## 2014-11-11 DIAGNOSIS — M9902 Segmental and somatic dysfunction of thoracic region: Secondary | ICD-10-CM

## 2014-11-11 DIAGNOSIS — R51 Headache: Secondary | ICD-10-CM | POA: Diagnosis not present

## 2014-11-11 MED ORDER — AMOXICILLIN 875 MG PO TABS: 875.0000 mg | ORAL_TABLET | Freq: Two times a day (BID) | ORAL | Status: DC

## 2014-11-11 NOTE — Progress Notes (Signed)
Pre visit review using our clinic review tool, if applicable. No additional management support is needed unless otherwise documented below in the visit note. 

## 2014-11-11 NOTE — Patient Instructions (Signed)
Keep doing the exercises Contonue the vitamins at this time Add melatonin 3-5 mg at about 6 pm ICe is your friend when needed We may need to consider a sleep study if we continue to have trouble at night Continue the message Amoxicillin 2 times daily for 10 days See me again in 4 weeks.

## 2014-11-11 NOTE — Assessment & Plan Note (Signed)
Decision today to treat with OMT was based on Physical Exam  After verbal consent patient was treated with ME, FPR, ST techniques in cervical, thoracic and rib areas  Patient tolerated the procedure well with improvement in symptoms  Patient given exercises, stretches and lifestyle modifications  See medications in patient instructions if given  Patient will follow up in 4 weeks  

## 2014-11-11 NOTE — Assessment & Plan Note (Signed)
I do believe a lot of this is still Musket skeletal in nature. We discussed icing regimen and patient doing the over-the-counter natural vitamins on a regular basis. I do think the patient does have potentially strep throat and patient was given an antibiotic-loaded this pain over the course last 2 weeks. We discussed trying to remain active. Patient does have a lot of anxiety that I also think is unfortunately intact in trimming. Patient was seen by rheumatologist and was diagnosed recently with a fibromyalgia. This is also going to continue to be an intermittent problem. I also think patient's motivation of gain better seems to be low at this time. Patient will come back again in 3 weeks for further evaluation and treatment. We may need to consider a sleep study with this pain associated with worsening headaches in the morning.
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
## 2014-12-03 ENCOUNTER — Ambulatory Visit (INDEPENDENT_AMBULATORY_CARE_PROVIDER_SITE_OTHER): Payer: PPO | Admitting: Family Medicine

## 2014-12-03 DIAGNOSIS — R69 Illness, unspecified: Secondary | ICD-10-CM

## 2014-12-03 NOTE — Progress Notes (Signed)
Pt showed up ~ 30 minutes late for a physical. Our clinic was full and she opted to reschedule.

## 2014-12-08 ENCOUNTER — Encounter: Payer: PPO | Admitting: Family Medicine

## 2014-12-09 ENCOUNTER — Encounter: Payer: Self-pay | Admitting: Family Medicine

## 2014-12-09 ENCOUNTER — Ambulatory Visit (INDEPENDENT_AMBULATORY_CARE_PROVIDER_SITE_OTHER): Payer: Self-pay | Admitting: Family Medicine

## 2014-12-09 VITALS — BP 112/80 | HR 107 | Ht 61.0 in | Wt 98.0 lb

## 2014-12-09 DIAGNOSIS — R51 Headache: Secondary | ICD-10-CM

## 2014-12-09 DIAGNOSIS — G4486 Cervicogenic headache: Secondary | ICD-10-CM

## 2014-12-09 NOTE — Patient Instructions (Signed)
Good to see you Boyfriend to keep doing message every other day ;) Ice when you need it Consider increasing the choline.  See me again in 2 months!!!!
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
## 2014-12-09 NOTE — Assessment & Plan Note (Signed)
Patient is feeling significantly better at this time. Patient states that she is no longer having worsening pain at this time. Patient states that the headache seems to be improving as well. Patient states that this is not affecting her daily activities as much as it was previously. Exam today shows improvement as well. Patient wondering to withhold any of the osteopathic manipulation today. We discussed continuing the natural supple mentation and patient will come back and see me again in 2 months.

## 2014-12-09 NOTE — Progress Notes (Signed)
Pre visit review using our clinic review tool, if applicable. No additional management support is needed unless otherwise documented below in the visit note. 

## 2015-01-07 ENCOUNTER — Ambulatory Visit (INDEPENDENT_AMBULATORY_CARE_PROVIDER_SITE_OTHER): Payer: 59 | Admitting: Family Medicine

## 2015-01-07 ENCOUNTER — Encounter: Payer: Self-pay | Admitting: Family Medicine

## 2015-01-07 VITALS — BP 100/74 | HR 101 | Ht 61.0 in | Wt 98.0 lb

## 2015-01-07 DIAGNOSIS — G4486 Cervicogenic headache: Secondary | ICD-10-CM

## 2015-01-07 DIAGNOSIS — R51 Headache: Secondary | ICD-10-CM

## 2015-01-07 DIAGNOSIS — R768 Other specified abnormal immunological findings in serum: Secondary | ICD-10-CM | POA: Insufficient documentation

## 2015-01-07 MED ORDER — KETOROLAC TROMETHAMINE 60 MG/2ML IM SOLN
60.0000 mg | Freq: Once | INTRAMUSCULAR | Status: AC
  Administered 2015-01-07: 60 mg via INTRAMUSCULAR

## 2015-01-07 MED ORDER — METHYLPREDNISOLONE ACETATE 80 MG/ML IJ SUSP
80.0000 mg | Freq: Once | INTRAMUSCULAR | Status: AC
  Administered 2015-01-07: 80 mg via INTRAMUSCULAR
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
## 2015-01-07 NOTE — Progress Notes (Signed)
Pre visit review using our clinic review tool, if applicable. No additional management support is needed unless otherwise documented below in the visit note. 

## 2015-01-07 NOTE — Assessment & Plan Note (Signed)
Likely some inflammtion in system.. Concern for an autoimmune disease still. We discussed a second opinion with a rheumatologist and patient would like to wait. Patient is concerned with the recurrent swelling of the throat and we will consider an ENT. Patient given 2 injections including a Depo-Medrol to see if this helps with some of the inflammation. Then we'll do a short course of anti-inflammatory's. Patient will call in 3 days and give me up today. If continuing to have symptoms we will need to consider either long-term steroid or referrals we discussed previously. Labs may be necessary.  ?reiters or other auto immune.   Spent  25 minutes with patient face-to-face and had greater than 50% of counseling including as described above in assessment and plan.

## 2015-01-07 NOTE — Patient Instructions (Addendum)
Good to see you Grace Torres is your friend.  Try the tennis balls in a tube sock at the base of the skull when you are feeling the discomfort.  2 injections today to decrease in inflammation in your system.  We will consider ENT referral if you do not get better in next couple of days.  I will send in antibiotics at that time.  Otherwise see me again in 2 weeks.

## 2015-01-22 ENCOUNTER — Ambulatory Visit (INDEPENDENT_AMBULATORY_CARE_PROVIDER_SITE_OTHER): Payer: 59 | Admitting: Family Medicine

## 2015-01-22 ENCOUNTER — Encounter: Payer: Self-pay | Admitting: Family Medicine

## 2015-01-22 VITALS — BP 112/80 | HR 87 | Ht 61.0 in | Wt 98.0 lb

## 2015-01-22 DIAGNOSIS — R51 Headache: Secondary | ICD-10-CM

## 2015-01-22 DIAGNOSIS — R768 Other specified abnormal immunological findings in serum: Secondary | ICD-10-CM

## 2015-01-22 DIAGNOSIS — G4486 Cervicogenic headache: Secondary | ICD-10-CM

## 2015-01-22 NOTE — Assessment & Plan Note (Signed)
Still think stress is likely contributing to most of her discomfort and pain. We discussed about different over-the-counter medications a can be beneficial. We discussed medication. We discussed proper posture. Patient is making improvement at this time. Patient will try aren't supplementation for the fatigue. Patient come back and see me again in 6-8 weeks for further evaluation and treatment.

## 2015-01-22 NOTE — Patient Instructions (Addendum)
Good to see you Remember you are smarter than 7 of 10 people at least! Iron  elemental iron daily take with  of vitamin C.  Continue everything else you are doing When trying to remember try to think of smells, colors, time of day when you read or learned something and it may help Continue same regimen on weekend for studying or routine as much as possible to help with memory.  Duexis up to 3 times daily as needed See me again in 8 weeks.  You will do great!

## 2015-01-22 NOTE — Progress Notes (Signed)
Pre visit review using our clinic review tool, if applicable. No additional management support is needed unless otherwise documented below in the visit note. 
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
## 2015-01-22 NOTE — Assessment & Plan Note (Signed)
positive

## 2015-03-09 ENCOUNTER — Encounter: Payer: Self-pay | Admitting: Neurology

## 2015-03-09 ENCOUNTER — Ambulatory Visit (INDEPENDENT_AMBULATORY_CARE_PROVIDER_SITE_OTHER): Payer: 59 | Admitting: Neurology

## 2015-03-09 VITALS — BP 90/68 | HR 74 | Ht 61.0 in | Wt 94.5 lb

## 2015-03-09 DIAGNOSIS — R51 Headache: Secondary | ICD-10-CM | POA: Diagnosis not present

## 2015-03-09 DIAGNOSIS — R4189 Other symptoms and signs involving cognitive functions and awareness: Secondary | ICD-10-CM | POA: Diagnosis not present

## 2015-03-09 DIAGNOSIS — G4486 Cervicogenic headache: Secondary | ICD-10-CM

## 2015-03-09 MED ORDER — DULOXETINE HCL 30 MG PO CPEP: 30.0000 mg | ORAL_CAPSULE | Freq: Every day | ORAL | Status: DC
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
## 2015-03-09 NOTE — Patient Instructions (Signed)
1. Start Cymbalta 30mg  daily for headaches 2. Minimize Tylenol or Aleve to 2-3 times a week 3. Refer for Neuropsychological evaluation at [facility] 4. Discuss fatigue and chest issues with Dr. Selena BattenKim 5. Follow-up in 4 months, call for any problems

## 2015-03-19 ENCOUNTER — Ambulatory Visit (INDEPENDENT_AMBULATORY_CARE_PROVIDER_SITE_OTHER): Payer: 59 | Admitting: Family Medicine

## 2015-03-19 ENCOUNTER — Encounter: Payer: Self-pay | Admitting: Family Medicine

## 2015-03-19 VITALS — BP 124/82 | HR 77 | Ht 61.0 in | Wt 93.0 lb

## 2015-03-19 DIAGNOSIS — H579 Unspecified disorder of eye and adnexa: Secondary | ICD-10-CM | POA: Diagnosis not present

## 2015-03-19 DIAGNOSIS — G4486 Cervicogenic headache: Secondary | ICD-10-CM

## 2015-03-19 DIAGNOSIS — R51 Headache: Secondary | ICD-10-CM | POA: Diagnosis not present
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
## 2015-03-19 NOTE — Patient Instructions (Signed)
Good to see you You need to see an eye doctor Vitamin A 500mg  daily Vitamin D 2000 IU daily Continue the iron I like the cymbalta it will take time to work Happy holidays!  See me again when you need me.

## 2015-03-19 NOTE — Progress Notes (Signed)
Pre visit review using our clinic review tool, if applicable. No additional management support is needed unless otherwise documented below in the visit note. 

## 2015-03-19 NOTE — Assessment & Plan Note (Addendum)
Patient feels like she is making some mild strides. I do think that possibly some visual acuity is also contributing to some of her headaches. We discussed icing regimen and home exercises. We discussed which activities to do an which was potentially avoid. We discussed seen at night doctor. Patient will consider this but she does not know if her insurance would cover it.I do feel that Cymbalta could be beneficial. Patient will come back and see me again in 3 months for further evaluation and treatment.  Spent  25 minutes with patient face-to-face and had greater than 50% of counseling including as described above in assessment and plan.

## 2015-04-01 ENCOUNTER — Encounter: Payer: Self-pay | Admitting: Family Medicine

## 2015-04-01 ENCOUNTER — Ambulatory Visit (INDEPENDENT_AMBULATORY_CARE_PROVIDER_SITE_OTHER): Payer: 59 | Admitting: Family Medicine

## 2015-04-01 ENCOUNTER — Other Ambulatory Visit (HOSPITAL_COMMUNITY)
Admission: RE | Admit: 2015-04-01 | Discharge: 2015-04-01 | Disposition: A | Discharge status: Home / Self Care | Payer: 59 | Source: Ambulatory Visit | Attending: Family Medicine | Admitting: Family Medicine

## 2015-04-01 VITALS — BP 102/70 | HR 95 | Temp 98.5°F | Ht 61.0 in | Wt 92.7 lb

## 2015-04-01 DIAGNOSIS — Z01419 Encounter for gynecological examination (general) (routine) without abnormal findings: Secondary | ICD-10-CM | POA: Insufficient documentation

## 2015-04-01 DIAGNOSIS — Z124 Encounter for screening for malignant neoplasm of cervix: Secondary | ICD-10-CM | POA: Diagnosis not present

## 2015-04-01 DIAGNOSIS — Z0000 Encounter for general adult medical examination without abnormal findings: Secondary | ICD-10-CM
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
## 2015-04-01 NOTE — Progress Notes (Signed)
Pre visit review using our clinic review tool, if applicable. No additional management support is needed unless otherwise documented below in the visit note. 

## 2015-04-01 NOTE — Addendum Note (Signed)
Addended by: Johnella MoloneyFUNDERBURK, Reinette Cuneo A on: 04/01/2015 10:05 AM   Modules accepted: Orders

## 2015-04-01 NOTE — Patient Instructions (Addendum)
BEFORE YOU LEAVE: -flu and Tdap if pt wishes -follow up yearly  -We have ordered a pap smear at this visit. It can take up to 1-2 weeks for results and processing. We will contact you with instructions IF your results are abnormal. Normal results will be [facility]. If you have not heard from us or can not find your results in [facility] in 2 weeks please contact our office.  -ensure you are taking Vit D3 1000 IU daily and folic acid 400mcg daily  -please use condoms for all sexual intercourse, consider the ParaGard and please call a gynecologist if you would like to try this for pregnancy prevention  We recommend the following healthy lifestyle measures: - eat a healthy whole foods diet consisting of regular small meals composed of vegetables, fruits, beans, nuts, seeds, healthy meats such as white chicken and fish and whole grains.  - avoid sweets, white starchy foods, fried foods, fast food, processed foods, sodas, red meet and other fattening foods.  - get a least 150-300 minutes of aerobic exercise per week.

## 2015-04-01 NOTE — Progress Notes (Signed)
Patient declines flu and Tdap vaccines today.

## 2015-04-03 LAB — CYTOLOGY - PAP

## 2015-04-06 ENCOUNTER — Telehealth: Payer: Self-pay | Admitting: Family Medicine

## 2015-04-06 NOTE — Telephone Encounter (Signed)
Patient returned your call and would like for you to call her back °

## 2015-04-13 ENCOUNTER — Ambulatory Visit (INDEPENDENT_AMBULATORY_CARE_PROVIDER_SITE_OTHER): Payer: 59 | Admitting: Family Medicine

## 2015-04-13 ENCOUNTER — Encounter: Payer: Self-pay | Admitting: Family Medicine

## 2015-04-13 VITALS — BP 92/68 | HR 89 | Temp 97.9°F | Ht 61.0 in | Wt 95.0 lb

## 2015-04-13 DIAGNOSIS — L219 Seborrheic dermatitis, unspecified: Secondary | ICD-10-CM | POA: Diagnosis not present

## 2015-04-13 DIAGNOSIS — L659 Nonscarring hair loss, unspecified: Secondary | ICD-10-CM

## 2015-04-13 LAB — TSH: TSH: 0.9 u[IU]/mL (ref 0.35–4.50)

## 2015-04-13 MED ORDER — BETAMETHASONE VALERATE 0.12 % EX FOAM: 1.0000 "application " | Freq: Every day | CUTANEOUS | Status: DC

## 2015-04-13 NOTE — Patient Instructions (Signed)
BEFORE YOU LEAVE: -lab  -We have ordered labs or studies at this visit. It can take up to 1-2 weeks for results and processing. We will contact you with instructions IF your results are abnormal. Normal results will be released to your [facility]. If you have not heard from us or can not find your results in [facility] in 2 weeks please contact our office.  -count hairs lost with normal brushing once daily  Use the steroid cream daily for 1-2 weeks when you have a flare of dandruff  Follow up as needed if persists or worsens or other concerns

## 2015-04-13 NOTE — Progress Notes (Signed)
Pre visit review using our clinic review tool, if applicable. No additional management support is needed unless otherwise documented below in the visit note. 
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
## 2015-04-28 ENCOUNTER — Ambulatory Visit (INDEPENDENT_AMBULATORY_CARE_PROVIDER_SITE_OTHER): Payer: 59 | Admitting: Family Medicine

## 2015-04-28 ENCOUNTER — Encounter: Payer: Self-pay | Admitting: Family Medicine

## 2015-04-28 VITALS — BP 118/76 | HR 71 | Wt 94.0 lb

## 2015-04-28 DIAGNOSIS — R519 Headache, unspecified: Secondary | ICD-10-CM

## 2015-04-28 DIAGNOSIS — R4189 Other symptoms and signs involving cognitive functions and awareness: Secondary | ICD-10-CM | POA: Diagnosis not present

## 2015-04-28 DIAGNOSIS — R51 Headache: Secondary | ICD-10-CM | POA: Diagnosis not present

## 2015-04-28 DIAGNOSIS — H579 Unspecified disorder of eye and adnexa: Secondary | ICD-10-CM | POA: Diagnosis not present

## 2015-04-28 DIAGNOSIS — F418 Other specified anxiety disorders: Secondary | ICD-10-CM

## 2015-04-28 MED ORDER — INDOMETHACIN 25 MG PO CAPS: 25.0000 mg | ORAL_CAPSULE | Freq: Three times a day (TID) | ORAL | Status: DC | PRN
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
## 2015-04-28 NOTE — Assessment & Plan Note (Signed)
Patient's headaches seemed to be more situational. I do think that this is more anxiety that is self provoked  and now is more of a behavioral response unconsciously for patient. Patient has had significant workup including advance imaging, laboratory as well as patient didn't see an ophthalmologist. Melina FiddlerWe've tried many different conservative therapy and medications. Patient given indomethacin to see if this would be possibly beneficial. We'll started a significantly low dose. Not optimistic that this will make a difference. I do believe the patient situational anxiety as well as causing this more and patient will be sent to behavioral health to learn behavioral response in coping mechanisms  Spent  25 minutes with patient face-to-face and had greater than 50% of counseling including as described above in assessment and plan.

## 2015-04-28 NOTE — Patient Instructions (Signed)
Great to see you  We will get you in with behavioral health to see if we can learn some behavioral changes to help with concentration and decrease the anxiety that is produced from studying.  Try the indomethacin 30 minutes before studying as needed to see if it helps Try the contacts I think they may help a little bit You will do great!   I am here if you have questions or see me again in 2-3 months Happy New Year!

## 2015-04-28 NOTE — Assessment & Plan Note (Signed)
Encouraged to wear contacts

## 2015-04-28 NOTE — Assessment & Plan Note (Signed)
I believe the patient is having significant anxiety and this is likely provoking them. Encourage patient to continue to stay on the Cymbalta. Do not feel that I wanted to give her any significant other medications to help her at this moment. I do believe that patient may respond well to behavioral modifications. Referred to behavioral health.  Spent  25 minutes with patient face-to-face and had greater than 50% of counseling including as described above in assessment and plan.

## 2015-05-28 ENCOUNTER — Encounter: Payer: Self-pay | Admitting: Family Medicine

## 2015-05-28 ENCOUNTER — Ambulatory Visit (INDEPENDENT_AMBULATORY_CARE_PROVIDER_SITE_OTHER): Payer: 59 | Admitting: Family Medicine

## 2015-05-28 VITALS — BP 92/70 | Temp 98.9°F | Wt 94.6 lb

## 2015-05-28 DIAGNOSIS — R300 Dysuria: Secondary | ICD-10-CM | POA: Diagnosis not present

## 2015-05-28 DIAGNOSIS — R319 Hematuria, unspecified: Secondary | ICD-10-CM | POA: Diagnosis not present

## 2015-05-28 DIAGNOSIS — N390 Urinary tract infection, site not specified: Secondary | ICD-10-CM | POA: Diagnosis not present

## 2015-05-28 DIAGNOSIS — N3001 Acute cystitis with hematuria: Secondary | ICD-10-CM | POA: Diagnosis not present

## 2015-05-28 DIAGNOSIS — R82998 Other abnormal findings in urine: Secondary | ICD-10-CM

## 2015-05-28 LAB — POCT URINALYSIS DIPSTICK
BILIRUBIN UA: NEGATIVE
Blood, UA: 3
GLUCOSE UA: NEGATIVE
Ketones, UA: NEGATIVE
Nitrite, UA: NEGATIVE
Protein, UA: NEGATIVE
Spec Grav, UA: 1.015
Urobilinogen, UA: 0.2
pH, UA: 8

## 2015-05-28 MED ORDER — NITROFURANTOIN MONOHYD MACRO 100 MG PO CAPS: 100.0000 mg | ORAL_CAPSULE | Freq: Two times a day (BID) | ORAL | Status: DC

## 2015-05-28 MED ORDER — PHENAZOPYRIDINE HCL 100 MG PO TABS: 100.0000 mg | ORAL_TABLET | Freq: Three times a day (TID) | ORAL | Status: DC | PRN
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
## 2015-05-28 NOTE — Patient Instructions (Signed)
Please complete antibiotic as directed. Increase water intake as directed. If symptoms do not improve in 2-3 days, worsen, or you develop a fever >101, please contact clinic for further follow up.  Urinary Tract Infection Urinary tract infections (UTIs) can develop anywhere along your urinary tract. Your urinary tract is your body's drainage system for removing wastes and extra water. Your urinary tract includes two kidneys, two ureters, a bladder, and a urethra. Your kidneys are a pair of bean-shaped organs. Each kidney is about the size of your fist. They are located below your ribs, one on each side of your spine. CAUSES Infections are caused by microbes, which are microscopic organisms, including fungi, viruses, and bacteria. These organisms are so small that they can only be seen through a microscope. Bacteria are the microbes that most commonly cause UTIs. SYMPTOMS  Symptoms of UTIs may vary by age and gender of the patient and by the location of the infection. Symptoms in young women typically include a frequent and intense urge to urinate and a painful, burning feeling in the bladder or urethra during urination. Older women and men are more likely to be tired, shaky, and weak and have muscle aches and abdominal pain. A fever may mean the infection is in your kidneys. Other symptoms of a kidney infection include pain in your back or sides below the ribs, nausea, and vomiting. DIAGNOSIS To diagnose a UTI, your caregiver will ask you about your symptoms. Your caregiver will also ask you to provide a urine sample. The urine sample will be tested for bacteria and white blood cells. White blood cells are made by your body to help fight infection. TREATMENT  Typically, UTIs can be treated with medication. Because most UTIs are caused by a bacterial infection, they usually can be treated with the use of antibiotics. The choice of antibiotic and length of treatment depend on your symptoms and the type of  bacteria causing your infection. HOME CARE INSTRUCTIONS  If you were prescribed antibiotics, take them exactly as your caregiver instructs you. Finish the medication even if you feel better after you have only taken some of the medication.  Drink enough water and fluids to keep your urine clear or pale yellow.  Avoid caffeine, tea, and carbonated beverages. They tend to irritate your bladder.  Empty your bladder often. Avoid holding urine for long periods of time.  Empty your bladder before and after sexual intercourse.  After a bowel movement, women should cleanse from front to back. Use each tissue only once. SEEK MEDICAL CARE IF:   You have back pain.  You develop a fever.  Your symptoms do not begin to resolve within 3 days. SEEK IMMEDIATE MEDICAL CARE IF:   You have severe back pain or lower abdominal pain.  You develop chills.  You have nausea or vomiting.  You have continued burning or discomfort with urination. MAKE SURE YOU:   Understand these instructions.  Will watch your condition.  Will get help right away if you are not doing well or get worse.   This information is not intended to replace advice given to you by your health care provider. Make sure you discuss any questions you have with your health care provider.   Document Released: 01/26/2005 Document Revised: 01/07/2015 Document Reviewed: 05/27/2011 Elsevier Interactive Patient Education [employer]

## 2015-05-28 NOTE — Progress Notes (Signed)
Pre visit review using our clinic review tool, if applicable. No additional management support is needed unless otherwise documented below in the visit note. 

## 2015-05-31 LAB — URINE CULTURE: Colony Count: >=100000

## 2015-07-10 ENCOUNTER — Ambulatory Visit (INDEPENDENT_AMBULATORY_CARE_PROVIDER_SITE_OTHER): Payer: 59 | Admitting: Neurology

## 2015-07-10 ENCOUNTER — Encounter: Payer: Self-pay | Admitting: Neurology

## 2015-07-10 VITALS — BP 96/70 | HR 71 | Ht 60.0 in | Wt 97.2 lb

## 2015-07-10 DIAGNOSIS — R51 Headache: Secondary | ICD-10-CM

## 2015-07-10 DIAGNOSIS — G4486 Cervicogenic headache: Secondary | ICD-10-CM

## 2015-07-10 MED ORDER — DULOXETINE HCL 30 MG PO CPEP: 30.0000 mg | ORAL_CAPSULE | Freq: Every day | ORAL | Status: DC

## 2015-07-10 NOTE — Patient Instructions (Signed)
1. Continue Cymbalta 30mg  daily 2. Continue with stress management. If symptoms continue, would consider referral to Behavioral Medicine to help with coping strategies 3. Follow-up in 7 months, call for any changes
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
## 2015-07-27 ENCOUNTER — Ambulatory Visit (INDEPENDENT_AMBULATORY_CARE_PROVIDER_SITE_OTHER): Payer: 59 | Admitting: Family Medicine

## 2015-07-27 ENCOUNTER — Encounter: Payer: Self-pay | Admitting: Family Medicine

## 2015-07-27 VITALS — BP 110/82 | HR 75 | Ht 60.0 in

## 2015-07-27 DIAGNOSIS — F418 Other specified anxiety disorders: Secondary | ICD-10-CM | POA: Diagnosis not present

## 2015-07-27 DIAGNOSIS — F39 Unspecified mood [affective] disorder: Secondary | ICD-10-CM | POA: Diagnosis not present

## 2015-07-27 DIAGNOSIS — R519 Headache, unspecified: Secondary | ICD-10-CM

## 2015-07-27 DIAGNOSIS — R51 Headache: Secondary | ICD-10-CM | POA: Diagnosis not present

## 2015-07-27 DIAGNOSIS — G4486 Cervicogenic headache: Secondary | ICD-10-CM

## 2015-07-27 MED ORDER — CYCLOBENZAPRINE HCL 5 MG PO TABS: 5.0000 mg | ORAL_TABLET | Freq: Every day | ORAL | Status: DC

## 2015-07-27 NOTE — Assessment & Plan Note (Signed)
Sent to formal physical therapy. I do think that this will be beneficial. Encourage her to continue the over-the-counter medications and icing regimen. Prescription for muscle relaxer given.

## 2015-07-27 NOTE — Progress Notes (Signed)
Pre visit review using our clinic review tool, if applicable. No additional management support is needed unless otherwise documented below in the visit note. 

## 2015-07-27 NOTE — Assessment & Plan Note (Signed)
Continue Cymbalta. I do think her not studying will be beneficial. Discussed continuing with more of the upper back strengthening. Follow up in 4-6 weeks.

## 2015-07-27 NOTE — Patient Instructions (Signed)
Good to see you  I think you will do great  Ice is your friend Flonase (fluticoasone) each nostril daily can help the nose.  Flexeril 5mg  at night to help with tightness See me again in 5-6 weeks.

## 2015-07-27 NOTE — Assessment & Plan Note (Signed)
Still feel that some of her headaches are secondary to cervicogenic but as well as secondary to anxiety and her mood disorder. Patient still does not want to go to behavioral health. We were able to talk to patient and she will go to formal physical therapy for the cervicogenic headaches and will work on upper back strength. Patient has not been responding to osteopathic manipulation. Patient will continue on the Cymbalta. Patient has worsening symptoms I would encourage her to go to behavioral health.

## 2015-07-27 NOTE — Assessment & Plan Note (Signed)
Multifactorial. Prescription for Flexeril given. Patient will be going to formal physical therapy to work on upper back which will hopefully help with some of the neck discomfort and this hopefully will help some of the headaches. I think patient also decreasing her studying will be beneficial and will help with the situational anxiety.
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
## 2015-09-07 ENCOUNTER — Encounter: Payer: Self-pay | Admitting: Family Medicine

## 2015-09-07 ENCOUNTER — Ambulatory Visit (INDEPENDENT_AMBULATORY_CARE_PROVIDER_SITE_OTHER): Payer: 59 | Admitting: Family Medicine

## 2015-09-07 VITALS — BP 104/76 | HR 70 | Ht 60.0 in | Wt 96.0 lb

## 2015-09-07 DIAGNOSIS — R51 Headache: Secondary | ICD-10-CM | POA: Diagnosis not present

## 2015-09-07 DIAGNOSIS — F418 Other specified anxiety disorders: Secondary | ICD-10-CM | POA: Diagnosis not present

## 2015-09-07 DIAGNOSIS — G4486 Cervicogenic headache: Secondary | ICD-10-CM

## 2015-09-07 MED ORDER — CYCLOBENZAPRINE HCL 5 MG PO TABS: 5.0000 mg | ORAL_TABLET | Freq: Every day | ORAL | Status: DC

## 2015-09-07 NOTE — Progress Notes (Signed)
Pre visit review using our clinic review tool, if applicable. No additional management support is needed unless otherwise documented below in the visit note. 

## 2015-09-07 NOTE — Patient Instructions (Signed)
Good to see you  I am so glad the muscle relaxer is helping!!! Keep doing what you are doing.  I am proud of you  See me again when you need me and I need to see you yearly to refill medicine.! You are going to do great !
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
## 2015-09-07 NOTE — Assessment & Plan Note (Signed)
Patient is doing significantly better. Patient continue to monitor closely. When patient does have a stressful situation she will try with some of the adaptive behavioral modification. Discussed previously.

## 2015-09-07 NOTE — Assessment & Plan Note (Signed)
Patient is doing significantly better with the headaches at this time. We will refill Flexeril for years applied. His lungs patient continues to do well we will see her more on an as-needed basis.

## 2016-03-04 ENCOUNTER — Ambulatory Visit: Payer: 59 | Admitting: Neurology

## 2016-03-04 DIAGNOSIS — Z029 Encounter for administrative examinations, unspecified: Secondary | ICD-10-CM

## 2016-03-11 ENCOUNTER — Encounter: Payer: Self-pay | Admitting: Neurology

## 2016-05-09 ENCOUNTER — Ambulatory Visit (INDEPENDENT_AMBULATORY_CARE_PROVIDER_SITE_OTHER): Payer: BLUE CROSS/BLUE SHIELD | Admitting: Neurology

## 2016-05-09 ENCOUNTER — Encounter: Payer: Self-pay | Admitting: Neurology

## 2016-05-09 VITALS — BP 108/70 | HR 87 | Ht 60.0 in | Wt 98.2 lb

## 2016-05-09 DIAGNOSIS — R51 Headache: Secondary | ICD-10-CM | POA: Diagnosis not present

## 2016-05-09 DIAGNOSIS — F321 Major depressive disorder, single episode, moderate: Secondary | ICD-10-CM

## 2016-05-09 DIAGNOSIS — G4486 Cervicogenic headache: Secondary | ICD-10-CM
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
## 2016-05-09 NOTE — Patient Instructions (Signed)
1. Refer to Behavioral Medicine for therapy 2. Continue Flexeril as needed 3. Continue with stress management techniques, it is important to address this, otherwise it can cause other symptoms! Take care and see you next year, call for any changes

## 2016-05-13 ENCOUNTER — Ambulatory Visit (INDEPENDENT_AMBULATORY_CARE_PROVIDER_SITE_OTHER): Payer: BLUE CROSS/BLUE SHIELD | Admitting: Family Medicine

## 2016-05-13 ENCOUNTER — Encounter: Payer: Self-pay | Admitting: Family Medicine

## 2016-05-13 VITALS — BP 88/58 | HR 72 | Temp 98.2°F | Ht 60.0 in | Wt 98.8 lb

## 2016-05-13 DIAGNOSIS — N6310 Unspecified lump in the right breast, unspecified quadrant: Secondary | ICD-10-CM

## 2016-05-13 DIAGNOSIS — Z23 Encounter for immunization: Secondary | ICD-10-CM

## 2016-05-13 NOTE — Progress Notes (Signed)
Pre visit review using our clinic review tool, if applicable. No additional management support is needed unless otherwise documented below in the visit note. 
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
## 2016-05-13 NOTE — Patient Instructions (Signed)
BEFORE YOU LEAVE: -flu shot  -We placed a referral for you as discussed to the breast center. It usually takes about 1-2 weeks to process and schedule this referral. If you have not heard from us regarding this appointment in 2 weeks please contact our office.

## 2016-06-02 ENCOUNTER — Other Ambulatory Visit: Payer: BLUE CROSS/BLUE SHIELD

## 2016-07-11 ENCOUNTER — Ambulatory Visit (INDEPENDENT_AMBULATORY_CARE_PROVIDER_SITE_OTHER): Payer: Self-pay | Admitting: Family Medicine

## 2016-07-11 VITALS — BP 94/62 | HR 89 | Temp 98.2°F | Resp 16 | Ht 58.5 in | Wt 100.6 lb

## 2016-07-11 DIAGNOSIS — J029 Acute pharyngitis, unspecified: Secondary | ICD-10-CM

## 2016-07-11 DIAGNOSIS — H65113 Acute and subacute allergic otitis media (mucoid) (sanguinous) (serous), bilateral: Secondary | ICD-10-CM

## 2016-07-11 DIAGNOSIS — J0100 Acute maxillary sinusitis, unspecified: Secondary | ICD-10-CM

## 2016-07-11 MED ORDER — FLUCONAZOLE 150 MG PO TABS: 150.0000 mg | ORAL_TABLET | Freq: Once | ORAL | 0 refills | Status: AC

## 2016-07-11 MED ORDER — AMOXICILLIN-POT CLAVULANATE 875-125 MG PO TABS: 1.0000 | ORAL_TABLET | Freq: Two times a day (BID) | ORAL | 0 refills | Status: DC

## 2016-07-11 NOTE — Patient Instructions (Addendum)
IF you received an x-ray today, you will receive an invoice from [facility]. Please contact [facility] at [phone] with questions or concerns regarding your invoice.   IF you received labwork today, you will receive an invoice from [place]. Please contact [facility] at [phone] with questions or concerns regarding your invoice.   Our billing staff will not be able to assist you with questions regarding bills from these companies.  You will be contacted with the lab results as soon as they are available. The fastest way to get your results is to activate your My Chart account. Instructions are located on the last page of this paperwork. If you have not heard from [place] regarding the results in 2 weeks, please contact this office.     Otitis Media, Adult Otitis media occurs when there is inflammation and fluid in the middle ear. Your middle ear is a part of the ear that contains bones for hearing as well as air that helps send sounds to your brain. What are the causes? This condition is caused by a blockage in the eustachian tube. This tube drains fluid from the ear to the back of the nose (nasopharynx). A blockage in this tube can be caused by an object or by swelling (edema) in the tube. Problems that can cause a blockage include:  A cold or other upper respiratory infection.  Allergies.  An irritant, such as tobacco smoke.  Enlarged adenoids. The adenoids are areas of soft tissue located high in the back of the throat, behind the nose and the roof of the mouth.  A mass in the nasopharynx.  Damage to the ear caused by pressure changes (barotrauma). What are the signs or symptoms? Symptoms of this condition include:  Ear pain.  A fever.  Decreased hearing.  A headache.  Tiredness (lethargy).  Fluid leaking from the ear.  Ringing in the ear. How is this diagnosed? This condition is diagnosed with a physical exam. During the exam your health  care provider will use an instrument called an otoscope to look into your ear and check for redness, swelling, and fluid. He or she will also ask about your symptoms. Your health care provider may also order tests, such as:  A test to check the movement of the eardrum (pneumatic otoscopy). This test is done by squeezing a small amount of air into the ear.  A test that changes air pressure in the middle ear to check how well the eardrum moves and whether the eustachian tube is working (tympanogram). How is this treated? This condition usually goes away on its own within 3-5 days. But if the condition is caused by a bacteria infection and does not go away own its own, or keeps coming back, your health care provider may:  Prescribe antibiotic medicines to treat the infection.  Prescribe or recommend medicines to control pain. Follow these instructions at home:  Take over-the-counter and prescription medicines only as told by your health care provider.  If you were prescribed an antibiotic medicine, take it as told by your health care provider. Do not stop taking the antibiotic even if you start to feel better.  Keep all follow-up visits as told by your health care provider. This is important. Contact a health care provider if:  You have bleeding from your nose.  There is a lump on your neck.  You are not getting better in 5 days.  You feel worse instead of better. Get help right away  if:  You have severe pain that is not controlled with medicine.  You have swelling, redness, or pain around your ear.  You have stiffness in your neck.  A part of your face is paralyzed.  The bone behind your ear (mastoid) is tender when you touch it.  You develop a severe headache. Summary  Otitis media is redness, soreness, and swelling of the middle ear.  This condition usually goes away on its own within 3-5 days.  If the problem does not go away in 3-5 days, your health care provider may  prescribe or recommend medicines to treat your symptoms.  If you were prescribed an antibiotic medicine, take it as told by your health care provider. This information is not intended to replace advice given to you by your health care provider. Make sure you discuss any questions you have with your health care provider. Document Released: 01/22/2004 Document Revised: 04/08/2016 Document Reviewed: 04/08/2016 Elsevier Interactive Patient Education  2017 Elsevier Inc.  Sinusitis, Adult Sinusitis is soreness and inflammation of your sinuses. Sinuses are hollow spaces in the bones around your face. They are located:  Around your eyes.  In the middle of your forehead.  Behind your nose.  In your cheekbones. Your sinuses and nasal passages are lined with a stringy fluid (mucus). Mucus normally drains out of your sinuses. When your nasal tissues get inflamed or swollen, the mucus can get trapped or blocked so air cannot flow through your sinuses. This lets bacteria, viruses, and funguses grow, and that leads to infection. Follow these instructions at home: Medicines   Take, use, or apply over-the-counter and prescription medicines only as told by your doctor. These may include nasal sprays.  If you were prescribed an antibiotic medicine, take it as told by your doctor. Do not stop taking the antibiotic even if you start to feel better. Hydrate and Humidify   Drink enough water to keep your pee (urine) clear or pale yellow.  Use a cool mist humidifier to keep the humidity level in your home above 50%.  Breathe in steam for 10-15 minutes, 3-4 times a day or as told by your doctor. You can do this in the bathroom while a hot shower is running.  Try not to spend time in cool or dry air. Rest   Rest as much as possible.  Sleep with your head raised (elevated).  Make sure to get enough sleep each night. General instructions   Put a warm, moist washcloth on your face 3-4 times a day or as  told by your doctor. This will help with discomfort.  Wash your hands often with soap and water. If there is no soap and water, use hand sanitizer.  Do not smoke. Avoid being around people who are smoking (secondhand smoke).  Keep all follow-up visits as told by your doctor. This is important. Contact a doctor if:  You have a fever.  Your symptoms get worse.  Your symptoms do not get better within 10 days. Get help right away if:  You have a very bad headache.  You cannot stop throwing up (vomiting).  You have pain or swelling around your face or eyes.  You have trouble seeing.  You feel confused.  Your neck is stiff.  You have trouble breathing. This information is not intended to replace advice given to you by your health care provider. Make sure you discuss any questions you have with your health care provider. Document Released: 10/05/2007 Document Revised: 12/13/2015 Document  Reviewed: 02/11/2015 Elsevier Interactive Patient Education  2017 Elsevier Inc. Pharyngitis Pharyngitis is redness, pain, and swelling (inflammation) of your pharynx. What are the causes? Pharyngitis is usually caused by infection. Most of the time, these infections are from viruses (viral) and are part of a cold. However, sometimes pharyngitis is caused by bacteria (bacterial). Pharyngitis can also be caused by allergies. Viral pharyngitis may be spread from person to person by coughing, sneezing, and personal items or utensils (cups, forks, spoons, toothbrushes). Bacterial pharyngitis may be spread from person to person by more intimate contact, such as kissing. What are the signs or symptoms? Symptoms of pharyngitis include:  Sore throat.  Tiredness (fatigue).  Low-grade fever.  Headache.  Joint pain and muscle aches.  Skin rashes.  Swollen lymph nodes.  Plaque-like film on throat or tonsils (often seen with bacterial pharyngitis). How is this diagnosed? Your health care provider  will ask you questions about your illness and your symptoms. Your medical history, along with a physical exam, is often all that is needed to diagnose pharyngitis. Sometimes, a rapid strep test is done. Other lab tests may also be done, depending on the suspected cause. How is this treated? Viral pharyngitis will usually get better in 3-4 days without the use of medicine. Bacterial pharyngitis is treated with medicines that kill germs (antibiotics). Follow these instructions at home:  Drink enough water and fluids to keep your urine clear or pale yellow.  Only take over-the-counter or prescription medicines as directed by your health care provider:  If you are prescribed antibiotics, make sure you finish them even if you start to feel better.  Do not take aspirin.  Get lots of rest.  Gargle with 8 oz of salt water ( tsp of salt per 1 qt of water) as often as every 1-2 hours to soothe your throat.  Throat lozenges (if you are not at risk for choking) or sprays may be used to soothe your throat. Contact a health care provider if:  You have large, tender lumps in your neck.  You have a rash.  You cough up green, yellow-brown, or bloody spit. Get help right away if:  Your neck becomes stiff.  You drool or are unable to swallow liquids.  You vomit or are unable to keep medicines or liquids down.  You have severe pain that does not go away with the use of recommended medicines.  You have trouble breathing (not caused by a stuffy nose). This information is not intended to replace advice given to you by your health care provider. Make sure you discuss any questions you have with your health care provider. Document Released: 04/18/2005 Document Revised: 09/24/2015 Document Reviewed: 12/24/2012 Elsevier Interactive Patient Education  2017 [employer].
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
## 2016-07-13 NOTE — Addendum Note (Signed)
Addended by: Collie SiadSTALLINGS, Iyanna Drummer A on: 07/13/2016 10:14 AM   Modules accepted: Level of Service

## 2016-09-27 ENCOUNTER — Ambulatory Visit (INDEPENDENT_AMBULATORY_CARE_PROVIDER_SITE_OTHER): Payer: BLUE CROSS/BLUE SHIELD | Admitting: Family Medicine

## 2016-09-27 ENCOUNTER — Encounter: Payer: Self-pay | Admitting: Family Medicine

## 2016-09-27 VITALS — BP 102/60 | HR 57 | Resp 12 | Ht 58.5 in | Wt 101.0 lb

## 2016-09-27 DIAGNOSIS — R102 Pelvic and perineal pain: Secondary | ICD-10-CM | POA: Diagnosis not present

## 2016-09-27 DIAGNOSIS — N946 Dysmenorrhea, unspecified: Secondary | ICD-10-CM

## 2016-09-27 LAB — POCT URINE PREGNANCY: PREG TEST UR: NEGATIVE
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
## 2016-09-27 NOTE — Patient Instructions (Addendum)
[name] I have seen you today for an acute visit.  A few things to remember from today's visit:   Pelvic pain in female - Plan: POCT urine pregnancy, US Pelvis Complete, US Transvaginal Non-OB, CBC  Dysmenorrhea Menstrual cramps (dysmenorrhea) are caused by the muscles of the uterus tightening (contracting) during a menstrual period. For some women, this discomfort is merely bothersome. For others, dysmenorrhea can be severe enough to interfere with everyday activities for a few days each month. Primary dysmenorrhea is menstrual cramps that last a couple of days when you start having menstrual periods or soon after. This often begins after a teenager starts having her period. As a woman gets older or has a baby, the cramps will usually lessen or disappear. Secondary dysmenorrhea begins later in life, lasts longer, and the pain may be stronger than primary dysmenorrhea. The pain may start before the period and last a few days after the period. What are the causes? Dysmenorrhea is usually caused by an underlying problem, such as:  The tissue lining the uterus grows outside of the uterus in other areas of the body (endometriosis).  The endometrial tissue, which normally lines the uterus, is found in or grows into the muscular walls of the uterus (adenomyosis).  The pelvic blood vessels are engorged with blood just before the menstrual period (pelvic congestive syndrome).  Overgrowth of cells (polyps) in the lining of the uterus or cervix.  Falling down of the uterus (prolapse) because of loose or stretched ligaments.  Depression.  Bladder problems, infection, or inflammation.  Problems with the intestine, a tumor, or irritable bowel syndrome.  Cancer of the female organs or bladder.  A severely tipped uterus.  A very tight opening or closed cervix.  Noncancerous tumors of the uterus (fibroids).  Pelvic inflammatory disease (PID).  Pelvic scarring (adhesions) from a previous  surgery.  Ovarian cyst.  An intrauterine device (IUD) used for birth control. What increases the risk? You may be at greater risk of dysmenorrhea if:  You are younger than age 25.  You started puberty early.  You have irregular or heavy bleeding.  You have never given birth.  You have a family history of this problem.  You are a smoker. What are the signs or symptoms?  Cramping or throbbing pain in your lower abdomen.  Headaches.  Lower back pain.  Nausea or vomiting.  Diarrhea.  Sweating or dizziness.  Loose stools. How is this diagnosed? A diagnosis is based on your history, symptoms, physical exam, diagnostic tests, or procedures. Diagnostic tests or procedures may include:  Blood tests.  Ultrasonography.  An examination of the lining of the uterus (dilation and curettage, D&C).  An examination inside your abdomen or pelvis with a scope (laparoscopy).  X-rays.  CT scan.  MRI.  An examination inside the bladder with a scope (cystoscopy).  An examination inside the intestine or stomach with a scope (colonoscopy, gastroscopy). How is this treated? Treatment depends on the cause of the dysmenorrhea. Treatment may include:  Pain medicine prescribed by your health care provider.  Birth control pills or an IUD with progesterone hormone in it.  Hormone replacement therapy.  Nonsteroidal anti-inflammatory drugs (NSAIDs). These may help stop the production of prostaglandins.  Surgery to remove adhesions, endometriosis, ovarian cyst, or fibroids.  Removal of the uterus (hysterectomy).  Progesterone shots to stop the menstrual period.  Cutting the nerves on the sacrum that go to the female organs (presacral neurectomy).  Electric current to the sacral nerves (  sacral nerve stimulation).  Antidepressant medicine.  Psychiatric therapy, counseling, or group therapy.  Exercise and physical therapy.  Meditation and yoga  therapy.  Acupuncture. Follow these instructions at home:  Only take over-the-counter or prescription medicines as directed by your health care provider.  Place a heating pad or hot water bottle on your lower back or abdomen. Do not sleep with the heating pad.  Use aerobic exercises, walking, swimming, biking, and other exercises to help lessen the cramping.  Massage to the lower back or abdomen may help.  Stop smoking.  Avoid alcohol and caffeine. Contact a health care provider if:  Your pain does not get better with medicine.  You have pain with sexual intercourse.  Your pain increases and is not controlled with medicines.  You have abnormal vaginal bleeding with your period.  You develop nausea or vomiting with your period that is not controlled with medicine. Get help right away if: You pass out. This information is not intended to replace advice given to you by your health care provider. Make sure you discuss any questions you have with your health care provider. Document Released: 04/18/2005 Document Revised: 09/24/2015 Document Reviewed: 10/04/2012 Elsevier Interactive Patient Education  2017 Elsevier Inc.    In general please monitor for signs of worsening symptoms and seek immediate medical attention if any concerning.  If symptoms are not resolved in a few days/weeks you should schedule a follow up appointment with your doctor, before if needed.  Please be sure you have an appointment already scheduled with your PCP before you leave today.

## 2016-09-28 ENCOUNTER — Encounter: Payer: Self-pay | Admitting: Family Medicine

## 2016-09-28 LAB — CBC
HCT: 36.6 % (ref 36.0–46.0)
HEMOGLOBIN: 11.5 g/dL — AB (ref 12.0–15.0)
MCHC: 31.4 g/dL (ref 30.0–36.0)
MCV: 69.8 fl — AB (ref 78.0–100.0)
PLATELETS: 305 10*3/uL (ref 150.0–400.0)
RBC: 5.24 Mil/uL — ABNORMAL HIGH (ref 3.87–5.11)
RDW: 14.5 % (ref 11.5–15.5)
WBC: 8.7 10*3/uL (ref 4.0–10.5)

## 2016-10-27 DIAGNOSIS — Z029 Encounter for administrative examinations, unspecified: Secondary | ICD-10-CM

## 2016-11-08 ENCOUNTER — Ambulatory Visit (INDEPENDENT_AMBULATORY_CARE_PROVIDER_SITE_OTHER): Payer: BLUE CROSS/BLUE SHIELD

## 2016-11-08 DIAGNOSIS — Z23 Encounter for immunization: Secondary | ICD-10-CM

## 2017-01-31 ENCOUNTER — Ambulatory Visit: Payer: Self-pay | Admitting: Nurse Practitioner

## 2017-02-07 ENCOUNTER — Encounter: Payer: Self-pay | Admitting: Nurse Practitioner

## 2017-02-07 ENCOUNTER — Ambulatory Visit (INDEPENDENT_AMBULATORY_CARE_PROVIDER_SITE_OTHER): Payer: BLUE CROSS/BLUE SHIELD | Admitting: Nurse Practitioner

## 2017-02-07 VITALS — BP 118/73 | HR 75 | Temp 98.2°F | Ht 58.5 in | Wt 99.2 lb

## 2017-02-07 DIAGNOSIS — Z7689 Persons encountering health services in other specified circumstances: Secondary | ICD-10-CM

## 2017-02-07 DIAGNOSIS — F418 Other specified anxiety disorders: Secondary | ICD-10-CM | POA: Diagnosis not present
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
## 2017-02-07 NOTE — Patient Instructions (Addendum)
Amar, Thank you for coming in to clinic today.  For your moods: - I recommend counseling to start.  We may need medications in the future, but this will provide you a place for talking and working on your reactions and responses to situations.  Psych Counseling ONLY Self Referral: 1. [facility] [place]   Address: [address] [place], [place], [place] [address] Hours: Open today  9AM-7PM Phone: [phone]  2. [facility] [employer]  [facility] Address: [address], [place], [place] [address] Phone: [phone]   3. [facility]  (psychiatry and counseling) Address: [address] [place], [place], [place] [address] Hours: 8AM-5PM (accepts walk in to establish) Phone: [phone]   Please schedule a follow-up appointment with Wilhelmina Mcardle, AGNP. Return in about 3 months (around 05/10/2017) for annual physical.   Call us sooner if you need [place] for moods or any other medical problem.  If you have any other questions or concerns, please feel free to call the clinic or send a message through MyChart. You may also schedule an earlier appointment if necessary.  You will receive a survey after today's visit either digitally by e-mail or paper by [employer]. Your experiences and feedback matter to [place].  Please respond so we know how we are doing as we provide care for you.   Wilhelmina Mcardle, DNP, AGNP-BC Adult Gerontology Nurse Practitioner [facility], [facility]
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
## 2017-05-09 ENCOUNTER — Ambulatory Visit: Payer: BLUE CROSS/BLUE SHIELD | Admitting: Neurology

## 2017-05-16 ENCOUNTER — Encounter: Payer: BLUE CROSS/BLUE SHIELD | Admitting: Nurse Practitioner

## 2017-05-16 ENCOUNTER — Encounter: Payer: Self-pay | Admitting: Nurse Practitioner

## 2017-05-16 ENCOUNTER — Ambulatory Visit (INDEPENDENT_AMBULATORY_CARE_PROVIDER_SITE_OTHER): Payer: BLUE CROSS/BLUE SHIELD | Admitting: Nurse Practitioner

## 2017-05-16 ENCOUNTER — Other Ambulatory Visit: Payer: Self-pay

## 2017-05-16 ENCOUNTER — Other Ambulatory Visit: Payer: Self-pay | Admitting: Nurse Practitioner

## 2017-05-16 VITALS — BP 121/64 | HR 63 | Temp 98.5°F | Ht 58.5 in | Wt 101.4 lb

## 2017-05-16 DIAGNOSIS — Z3200 Encounter for pregnancy test, result unknown: Secondary | ICD-10-CM | POA: Diagnosis not present

## 2017-05-16 DIAGNOSIS — Z124 Encounter for screening for malignant neoplasm of cervix: Secondary | ICD-10-CM | POA: Diagnosis not present

## 2017-05-16 DIAGNOSIS — Z0000 Encounter for general adult medical examination without abnormal findings: Secondary | ICD-10-CM | POA: Diagnosis not present

## 2017-05-16 NOTE — Patient Instructions (Addendum)
Camara, Thank you for coming in to clinic today.  1. Sleep Hygiene Tips  Take medicines only as directed by your health care provider.  Keep regular sleeping and waking hours. Avoid naps.  Keep a sleep diary to help you and your health care provider figure out what could be causing your insomnia. Include:  When you sleep.  When you wake up during the night.  How well you sleep.  How rested you feel the next day.  Any side effects of medicines you are taking.  What you eat and drink.  Make your bedroom a comfortable place where it is easy to fall asleep:  Put up shades or special blackout curtains to block light from outside.  Use a white noise machine to block noise.  Keep the temperature cool.  Exercise regularly as directed by your health care provider. Avoid exercising right before bedtime.  Use relaxation techniques to manage stress. Ask your health care provider to suggest some techniques that may work well for you. These may include:  Breathing exercises.  Routines to release muscle tension.  Visualizing peaceful scenes.  Cut back on alcohol, caffeinated beverages, and cigarettes, especially close to bedtime. These can disrupt your sleep.  Do not overeat or eat spicy foods right before bedtime. This can lead to digestive discomfort that can make it hard for you to sleep.  Limit screen use before bedtime. This includes:  Watching TV.  Using your smartphone, tablet, and computer.  Stick to a routine. This can help you fall asleep faster. Try to do a quiet activity, brush your teeth, and go to bed at the same time each night.  Get out of bed if you are still awake after 15 minutes of trying to sleep. Keep the lights down, but try reading or doing a quiet activity. When you feel sleepy, go back to bed.  Make sure that you drive carefully. Avoid driving if you feel very sleepy.  Keep all follow-up appointments as directed by your health care provider. This is  important.   Melatonin: 2-5 g at bedtime if these sleep hygiene techniques.   Please schedule a follow-up appointment with Wilhelmina McardleLauren Daysean Tinkham, AGNP. Return in about 1 year (around 05/16/2018) for annual physical.  If you have any other questions or concerns, please feel free to call the clinic or send a message through MyChart. You may also schedule an earlier appointment if necessary.  You will receive a survey after today's visit either digitally by e-mail or paper by [employer] mail. Your experiences and feedback matter to us.  Please respond so we know how we are doing as we provide care for you.   Wilhelmina McardleLauren Emile Ringgenberg, DNP, AGNP-BC Adult Gerontology Nurse Practitioner [facility], [facility]
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
## 2017-05-17 ENCOUNTER — Telehealth: Payer: Self-pay

## 2017-05-17 LAB — LIPID PANEL
Cholesterol: 112 mg/dL (ref <200)
HDL: 36 mg/dL — ABNORMAL LOW (ref >50)
LDL Cholesterol (Calc): 54 mg/dL (calc)
Non-HDL Cholesterol (Calc): 76 mg/dL (calc) (ref <130)
Total CHOL/HDL Ratio: 3.1 (calc) (ref <5.0)
Triglycerides: 132 mg/dL (ref <150)

## 2017-05-17 LAB — CBC WITH DIFFERENTIAL/PLATELET
Basophils Absolute: 42 cells/uL (ref 0–200)
Basophils Relative: 0.6 %
Eosinophils Absolute: 273 cells/uL (ref 15–500)
Eosinophils Relative: 3.9 %
HCT: 38.8 % (ref 35.0–45.0)
Hemoglobin: 12 g/dL (ref 11.7–15.5)
Lymphs Abs: 1841 cells/uL (ref 850–3900)
MCH: 21.5 pg — ABNORMAL LOW (ref 27.0–33.0)
MCHC: 30.9 g/dL — ABNORMAL LOW (ref 32.0–36.0)
MCV: 69.5 fL — ABNORMAL LOW (ref 80.0–100.0)
Monocytes Relative: 7 %
Neutro Abs: 4354 cells/uL (ref 1500–7800)
Neutrophils Relative %: 62.2 %
Platelets: 370 10*3/uL (ref 140–400)
RBC: 5.58 10*6/uL — ABNORMAL HIGH (ref 3.80–5.10)
RDW: 14 % (ref 11.0–15.0)
Total Lymphocyte: 26.3 %
WBC mixed population: 490 cells/uL (ref 200–950)
WBC: 7 10*3/uL (ref 3.8–10.8)

## 2017-05-17 LAB — COMPREHENSIVE METABOLIC PANEL
AG Ratio: 1.3 (calc) (ref 1.0–2.5)
ALT: 17 U/L (ref 6–29)
AST: 20 U/L (ref 10–30)
Albumin: 4.4 g/dL (ref 3.6–5.1)
Alkaline phosphatase (APISO): 43 U/L (ref 33–115)
BUN: 9 mg/dL (ref 7–25)
CO2: 25 mmol/L (ref 20–32)
Calcium: 9.3 mg/dL (ref 8.6–10.2)
Chloride: 105 mmol/L (ref 98–110)
Creat: 0.77 mg/dL (ref 0.50–1.10)
Globulin: 3.5 g/dL (calc) (ref 1.9–3.7)
Glucose, Bld: 78 mg/dL (ref 65–99)
Potassium: 4.1 mmol/L (ref 3.5–5.3)
Sodium: 138 mmol/L (ref 135–146)
Total Bilirubin: 0.2 mg/dL (ref 0.2–1.2)
Total Protein: 7.9 g/dL (ref 6.1–8.1)

## 2017-05-17 LAB — TSH: TSH: 1.08 mIU/L

## 2017-05-17 LAB — CBC MORPHOLOGY

## 2017-05-17 LAB — HCG, QUANTITATIVE, PREGNANCY: HCG, Total, QN: <2 m[IU]/mL

## 2017-05-17 NOTE — Telephone Encounter (Signed)
-----   Message from Grace ManilaLauren Renee Kennedy, NP sent at 05/17/2017 11:02 AM EST ----- CBC: No anemia today, but iron deficiency is still likely present.  She still has small RBCs with low heme(iron) content.  Resume taking ferrous sulfate 325 mg tablets. Take 2 tablets every other day with breakfast.  TSH: normal.  This means your thyroid function is normal. CMP: Your CMP is normal.  Normal fasting glucose and normal electrolytes, kidney function, and liver function. Lipid: Normal lipids except for low HDL.  HDL is good cholesterol and can protect against heart attack and stroke when higher than 59 in females.  Eat lots of omega3 fatty acids and increase exercise to raise HDL. Otherwise, is very good lipid panel. HCG: pt is not pregnant today.

## 2017-05-17 NOTE — Telephone Encounter (Signed)
The pt was notified. No questions or concerns. 

## 2017-05-18 LAB — PAP IG (IMAGE GUIDED)

## 2017-06-09 ENCOUNTER — Ambulatory Visit (INDEPENDENT_AMBULATORY_CARE_PROVIDER_SITE_OTHER): Payer: BLUE CROSS/BLUE SHIELD | Admitting: Obstetrics and Gynecology

## 2017-06-09 ENCOUNTER — Encounter: Payer: BLUE CROSS/BLUE SHIELD | Admitting: Obstetrics and Gynecology

## 2017-06-09 ENCOUNTER — Encounter: Payer: Self-pay | Admitting: Obstetrics and Gynecology

## 2017-06-09 VITALS — BP 127/80 | HR 76 | Ht 60.0 in | Wt 101.7 lb

## 2017-06-09 DIAGNOSIS — Z3169 Encounter for other general counseling and advice on procreation: Secondary | ICD-10-CM | POA: Diagnosis not present

## 2017-06-09 DIAGNOSIS — N926 Irregular menstruation, unspecified: Secondary | ICD-10-CM | POA: Diagnosis not present

## 2017-06-09 DIAGNOSIS — R87612 Low grade squamous intraepithelial lesion on cytologic smear of cervix (LGSIL): Secondary | ICD-10-CM

## 2017-06-09 NOTE — Progress Notes (Signed)
Pt had an abnormal cells in her cervix at her last pap and discuss fertility.

## 2017-06-09 NOTE — Patient Instructions (Signed)
Preparing for Pregnancy If you are considering becoming pregnant, make an appointment to see your regular health care provider to learn how to prepare for a safe and healthy pregnancy (preconception care). During a preconception care visit, your health care provider will:  Do a complete physical exam, including a Pap test.  Take a complete medical history.  Give you information, answer your questions, and help you resolve problems.  Preconception checklist Medical history  Tell your health care provider about any current or past medical conditions. Your pregnancy or your ability to become pregnant may be affected by chronic conditions, such as diabetes, chronic hypertension, and thyroid problems.  Include your family's medical history as well as your partner's medical history.  Tell your health care provider about any history of STIs (sexually transmitted infections).These can affect your pregnancy. In some cases, they can be passed to your baby. Discuss any concerns that you have about STIs.  If indicated, discuss the benefits of genetic testing. This testing will show whether there are any genetic conditions that may be passed from you or your partner to your baby.  Tell your health care provider about: ? Any problems you have had with conception or pregnancy. ? Any medicines you take. These include vitamins, herbal supplements, and over-the-counter medicines. ? Your history of immunizations. Discuss any vaccinations that you may need.  Diet  Ask your health care provider what to include in a healthy diet that has a balance of nutrients. This is especially important when you are pregnant or preparing to become pregnant.  Ask your health care provider to help you reach a healthy weight before pregnancy. ? If you are overweight, you may be at higher risk for certain complications, such as high blood pressure, diabetes, and preterm birth. ? If you are underweight, you are more likely  to have a baby who has a low birth weight.  Lifestyle, work, and home  Let your health care provider know: ? About any lifestyle habits that you have, such as alcohol use, drug use, or smoking. ? About recreational activities that may put you at risk during pregnancy, such as downhill skiing and certain exercise programs. ? Tell your health care provider about any international travel, especially any travel to places with an active Zika virus outbreak. ? About harmful substances that you may be exposed to at work or at home. These include chemicals, pesticides, radiation, or even litter boxes. ? If you do not feel safe at home.  Mental health  Tell your health care provider about: ? Any history of mental health conditions, including feelings of depression, sadness, or anxiety. ? Any medicines that you take for a mental health condition. These include herbs and supplements.  Home instructions to prepare for pregnancy Lifestyle  Eat a balanced diet. This includes fresh fruits and vegetables, whole grains, lean meats, low-fat dairy products, healthy fats, and foods that are high in fiber. Ask to meet with a nutritionist or registered dietitian for assistance with meal planning and goals.  Get regular exercise. Try to be active for at least 30 minutes a day on most days of the week. Ask your health care provider which activities are safe during pregnancy.  Do not use any products that contain nicotine or tobacco, such as cigarettes and e-cigarettes. If you need help quitting, ask your health care provider.  Do not drink alcohol.  Do not take illegal drugs.  Maintain a healthy weight. Ask your health care provider what weight range is   right for you.  General instructions  Keep an accurate record of your menstrual periods. This makes it easier for your health care provider to determine your baby's due date.  Begin taking prenatal vitamins and folic acid supplements daily as directed by  your health care provider.  Manage any chronic conditions, such as high blood pressure and diabetes, as told by your health care provider. This is important.  How do I know that I am pregnant? You may be pregnant if you have been sexually active and you miss your period. Symptoms of early pregnancy include:  Mild cramping.  Very light vaginal bleeding (spotting).  Feeling unusually tired.  Nausea and vomiting (morning sickness).  If you have any of these symptoms and you suspect that you might be pregnant, you can take a home pregnancy test. These tests check for a hormone in your urine (human chorionic gonadotropin, or hCG). A woman's body begins to make this hormone during early pregnancy. These tests are very accurate. Wait until at least the first day after you miss your period to take one. If the test shows that you are pregnant (you get a positive result), call your health care provider to make an appointment for prenatal care. What should I do if I become pregnant?  Make an appointment with your health care provider as soon as you suspect you are pregnant.  Do not use any products that contain nicotine, such as cigarettes, chewing tobacco, and e-cigarettes. If you need help quitting, ask your health care provider.  Do not drink alcoholic beverages. Alcohol is related to a number of birth defects.  Avoid toxic odors and chemicals.  You may continue to have sexual intercourse if it does not cause pain or other problems, such as vaginal bleeding. This information is not intended to replace advice given to you by your health care provider. Make sure you discuss any questions you have with your health care provider. Document Released: 03/31/2008 Document Revised: 12/15/2015 Document Reviewed: 11/08/2015 Elsevier Interactive Patient Education  2018 Elsevier Inc.  

## 2017-06-10 LAB — BETA HCG QUANT (REF LAB): hCG Quant: <1 m[IU]/mL

## 2017-06-11 ENCOUNTER — Encounter: Payer: Self-pay | Admitting: Obstetrics and Gynecology
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
## 2017-07-28 ENCOUNTER — Other Ambulatory Visit: Payer: Self-pay

## 2017-07-28 ENCOUNTER — Ambulatory Visit: Payer: BLUE CROSS/BLUE SHIELD | Admitting: Physician Assistant

## 2017-07-28 ENCOUNTER — Encounter: Payer: Self-pay | Admitting: Physician Assistant

## 2017-07-28 VITALS — BP 111/72 | HR 78 | Temp 98.6°F | Resp 16 | Ht 58.5 in | Wt 101.4 lb

## 2017-07-28 DIAGNOSIS — N926 Irregular menstruation, unspecified: Secondary | ICD-10-CM | POA: Diagnosis not present

## 2017-07-28 DIAGNOSIS — Z130 Encounter for screening for diseases of the blood and blood-forming organs and certain disorders involving the immune mechanism: Secondary | ICD-10-CM

## 2017-07-28 LAB — POCT WET + KOH PREP
Trich by wet prep: ABSENT
Yeast by KOH: ABSENT
Yeast by wet prep: ABSENT

## 2017-07-28 LAB — POCT URINALYSIS DIP (MANUAL ENTRY)
Bilirubin, UA: NEGATIVE
Glucose, UA: NEGATIVE mg/dL
Ketones, POC UA: NEGATIVE mg/dL
Leukocytes, UA: NEGATIVE
Nitrite, UA: NEGATIVE
Protein Ur, POC: NEGATIVE mg/dL
Spec Grav, UA: 1.015 (ref 1.010–1.025)
Urobilinogen, UA: 0.2 E.U./dL
pH, UA: 7 (ref 5.0–8.0)

## 2017-07-28 LAB — POCT CBC
Granulocyte percent: 60.4 % (ref 37–80)
HCT, POC: 36.8 % — AB (ref 37.7–47.9)
Hemoglobin: 11.5 g/dL — AB (ref 12.2–16.2)
Lymph, poc: 2.5 (ref 0.6–3.4)
MCH, POC: 21.6 pg — AB (ref 27–31.2)
MCHC: 32.3 g/dL (ref 31.8–35.4)
MCV: 69.1 fL — AB (ref 80–97)
MID (cbc): 0.6 (ref 0–0.9)
MPV: 9.2 fL (ref 0–99.8)
POC Granulocyte: 4.7 (ref 2–6.9)
POC LYMPH PERCENT: 32.4 % (ref 10–50)
POC MID %: 7.2 % (ref 0–12)
Platelet Count, POC: 361 10*3/uL (ref 142–424)
RBC: 5.32 M/uL (ref 4.04–5.48)
RDW, POC: 14.7 %
WBC: 7.7 10*3/uL (ref 4.6–10.2)

## 2017-07-28 LAB — POCT URINE PREGNANCY: Preg Test, Ur: NEGATIVE

## 2017-07-28 NOTE — Progress Notes (Signed)
Grace Torres  MRN: [id number] DOB: 04-23-1991  PCP: Galen Manila, NP  Subjective:  Pt is a 27 year old female who presents to clinic for abnormal menstrual cycles.  She and her husband started trying to conceive 01/2018. Since that time her periods "shifted" from the end of the month to the middle.   This month her period was 3/19-3/25, then started again this am with bright red blood, now it is brownish, "little bit of uncomfortable lower abdomen".  Historically periods were always 7 days with a heavy flow.  This month period started on the 19th, "light spotting". That night she experienced heavy bleeding x 7 days.    Historically periods always at the end of the month. "I have endometriosis" (per pt) she has never has transvaginal ultrasound.  She has never used birth control.   Last Pap 05/16/2017 - LSIL is abnormal cells without identification of cervical cancer. Repeat pap smear 6 months. She has f/u appt with June.  GYN is Dr. Valentino Saxon in [place].   Review of Systems  Constitutional: Negative for chills, fatigue and fever.  Cardiovascular: Negative for chest pain and palpitations.  Gastrointestinal: Negative for abdominal pain, diarrhea, nausea and vomiting.  Genitourinary: Positive for menstrual problem. Negative for decreased urine volume, difficulty urinating, dysuria, enuresis, flank pain, frequency, hematuria, pelvic pain, urgency, vaginal bleeding, vaginal discharge and vaginal pain.  Musculoskeletal: Negative for back pain.    Patient Active Problem List   Diagnosis Date Noted  . Situational anxiety 04/28/2015  . Visual complaint 03/19/2015  . Cognitive changes 03/09/2015  . ANA positive 01/07/2015  . Nonallopathic lesion of cervical region 10/21/2014  . Nonallopathic lesion of thoracic region 10/21/2014  . Nonallopathic lesion-rib cage 10/21/2014  . Cervicogenic headache 10/06/2014  . Mood disorder (HCC) 08/15/2014  . Depression 08/15/2014  .  Worsening headaches 08/13/2014  . Neck pain 08/13/2014  . Headache 07/24/2013  . Concussion with no loss of consciousness 03/26/2013    Current Outpatient Medications on File Prior to Visit  Medication Sig Dispense Refill  . Prenatal Vit-Fe Fumarate-FA (PRENATAL MULTIVITAMIN) TABS tablet Take 1 tablet by mouth daily at 12 noon.     No current facility-administered medications on file prior to visit.     No Known Allergies   Objective:  BP 111/72   Pulse 78   Temp 98.6 F (37 C) (Oral)   Resp 16   Ht 4' 10.5" (1.486 m)   Wt 101 lb 6.4 oz (46 kg)   LMP 07/18/2017   SpO2 99%   BMI 20.83 kg/m   Physical Exam  Constitutional: She is oriented to person, place, and time and well-developed, well-nourished, and in no distress. No distress.  Cardiovascular: Normal rate, regular rhythm and normal heart sounds.  Genitourinary: Uterus normal, cervix normal, right adnexa normal and left adnexa normal. Watery  clear  red and vaginal discharge found.  Genitourinary Comments: Closed cervical os.  Neurological: She is alert and oriented to person, place, and time. GCS score is 15.  Skin: Skin is warm and dry.  Psychiatric: Mood, memory, affect and judgment normal.  Vitals reviewed.   Assessment and Plan :  1. Irregular menses 2. Screening, anemia, deficiency, iron - Iron, TIBC and Ferritin Panel - POCT urinalysis dipstick - POCT urine pregnancy - Beta hCG quant (ref lab) - POCT CBC - POCT Wet + KOH Prep - pt presents with abnormal periods. HPI could possibly suggest implantation trauma vs. Blighted ovum vs endometriosis. POCT labs  are negative. Plan to check serum hcg. No concerning findings on PE. Last Pap 05/16/2017 - LSIL is abnormal cells without identification of cervical cancer. Repeat pap scheduled for June.  CBC suggests iron deficiency anemia - plan to check iron panel. Advised her to f/u with her GYN for transvaginal ultrasound. She understands and agrees.   Marco CollieWhitney Avacyn Kloosterman,  PA-C  Primary Care at [facility] 07/28/2017 4:32 PM

## 2017-07-28 NOTE — Patient Instructions (Addendum)
Your urine pregnancy test is negative.  Your hcg blood test will take a few days to process.   You are positive for bacterial vaginosis. I will await for your hcg blood test to decide treatment for this. A woman's body does a great job of clearing this on it's own.  Please follow-up with your GYN and consider having a transvaginal ultrasound.   It looks like you have iron deficiency anemia and may not be getting enough iron from your supplements. I will contact you with the results of your iron test when they come back  Come back or go to the emergency department if your symptoms worsen.    Blighted Ovum A blighted ovum is a common kind of early pregnancy failure. It happens when a fertilized egg attaches to the uterus but stops growing. Even though the egg never develops, the body acts like it is pregnant. A sac starts to form around the egg, and tissue to support a baby starts to form in the placenta. What are the causes? This condition is usually caused by a genetic defect in the egg. What are the signs or symptoms? Early symptoms of this condition are the same as those of early pregnancy. They include:  A missed menstrual period.  Fatigue.  Feeling sick to your stomach (nauseous).  Sore breasts.  Later symptoms are those of pregnancy loss. They include:  Abdominal cramps.  Vaginal bleeding or spotting.  A menstrual period that is heavier than usual.  How is this diagnosed? This condition is usually diagnosed during a routine ultrasound. It can be confirmed with blood tests. How is this treated? This condition may be treated by:  Waiting until your body naturally gets rid of the empty egg sac and placenta (miscarriage).  Taking medicine to start a miscarriage. This medicine can be taken by mouth or placed into the vagina.  Having a surgical procedure to remove the tissue. Your health care provider would open the entrance to your womb (dilation) and remove the tissue  (curettage).  Follow these instructions at home:  Take over-the-counter and prescription medicines only as told by your health care provider.  Talk to your health care provider about when you can try to get pregnant again. Having this condition does not mean you will lose future pregnancies.  After your miscarriage: ? Rest at home for a few days. ? You may bleed heavily for a week or more, and you may have light bleeding for a couple weeks after that. Wear a pad until vaginal bleeding stops. Contact a health care provider if:  You have a fever or chills.  Your pain medicine is not helping.  You have vaginal bleeding that continues for longer than expected. Get help right away if:  You have severe abdominal pain.  You feel dizzy or faint.  You pass out.  You have very heavy vaginal bleeding. A sign that vaginal bleeding is very heavy is if blood soaks through two large sanitary pads an hour for more than two hours. This information is not intended to replace advice given to you by your health care provider. Make sure you discuss any questions you have with your health care provider. Document Released: 08/03/2010 Document Revised: 09/24/2015 Document Reviewed: 09/03/2014 Elsevier Interactive Patient Education  [employer].  Thank you for coming in today. I hope you feel we met your needs.  Feel free to call PCP if you have any questions or further requests.  Please consider signing up for  MyChart if you do not already have it, as this is a great way to communicate with me.  Best,  Whitney McVey, PA-C   IF you received an x-ray today, you will receive an invoice from [facility]. Please contact [facility] at [phone] with questions or concerns regarding your invoice.   IF you received labwork today, you will receive an invoice from [place] [facility]. Please contact [facility] at [phone] with questions or concerns regarding your invoice.   Our billing  staff will not be able to assist you with questions regarding bills from these companies.  You will be contacted with the lab results as soon as they are available. The fastest way to get your results is to activate your My Chart account. Instructions are located on the last page of this paperwork. If you have not heard from [place] regarding the results in 2 weeks, please contact this office.

## 2017-07-29 LAB — IRON,TIBC AND FERRITIN PANEL
Ferritin: 33 ng/mL (ref 15–150)
Iron Saturation: 23 % (ref 15–55)
Iron: 82 ug/dL (ref 27–159)
Total Iron Binding Capacity: 351 ug/dL (ref 250–450)
UIBC: 269 ug/dL (ref 131–425)

## 2017-07-29 LAB — BETA HCG QUANT (REF LAB): hCG Quant: <1 m[IU]/mL

## 2017-10-10 ENCOUNTER — Other Ambulatory Visit: Payer: Self-pay

## 2017-10-10 ENCOUNTER — Ambulatory Visit (INDEPENDENT_AMBULATORY_CARE_PROVIDER_SITE_OTHER): Payer: BLUE CROSS/BLUE SHIELD | Admitting: Nurse Practitioner

## 2017-10-10 ENCOUNTER — Encounter: Payer: Self-pay | Admitting: Nurse Practitioner

## 2017-10-10 VITALS — BP 114/64 | HR 66 | Temp 98.3°F | Ht 58.5 in | Wt 103.4 lb

## 2017-10-10 DIAGNOSIS — R87612 Low grade squamous intraepithelial lesion on cytologic smear of cervix (LGSIL): Secondary | ICD-10-CM | POA: Diagnosis not present

## 2017-10-10 NOTE — Patient Instructions (Addendum)
Jacinda Era BumpersSiu Malphrus,   Thank you for coming in to clinic today.  1. If PAP continues to be abnormal, followup with Dr. Valentino Saxonherry for further evaluation and treatment.  Please schedule a follow-up appointment with Wilhelmina McardleLauren Bell Carbo, AGNP. Return in about 6 months (around 04/11/2018) for annual physical.  If you have any other questions or concerns, please feel free to call the clinic or send a message through MyChart. You may also schedule an earlier appointment if necessary.  You will receive a survey after today's visit either digitally by e-mail or paper by [employer] mail. Your experiences and feedback matter to us.  Please respond so we know how we are doing as we provide care for you.   Wilhelmina McardleLauren Chayanne Filippi, DNP, AGNP-BC Adult Gerontology Nurse Practitioner [facility], [facility]
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
## 2017-10-11 LAB — PAP IG AND HPV HIGH-RISK: HPV DNA High Risk: DETECTED — AB

## 2017-12-30 ENCOUNTER — Other Ambulatory Visit: Payer: Self-pay

## 2017-12-30 ENCOUNTER — Emergency Department (HOSPITAL_COMMUNITY): Payer: BLUE CROSS/BLUE SHIELD

## 2017-12-30 ENCOUNTER — Emergency Department (HOSPITAL_COMMUNITY)
Admission: EM | Admit: 2017-12-30 | Discharge: 2017-12-30 | Disposition: A | Discharge status: Home / Self Care | Payer: BLUE CROSS/BLUE SHIELD | Attending: Emergency Medicine | Admitting: Emergency Medicine

## 2017-12-30 DIAGNOSIS — M25561 Pain in right knee: Secondary | ICD-10-CM | POA: Insufficient documentation

## 2017-12-30 DIAGNOSIS — M25562 Pain in left knee: Secondary | ICD-10-CM | POA: Insufficient documentation

## 2017-12-30 DIAGNOSIS — Y92410 Unspecified street and highway as the place of occurrence of the external cause: Secondary | ICD-10-CM | POA: Diagnosis not present

## 2017-12-30 DIAGNOSIS — Y9389 Activity, other specified: Secondary | ICD-10-CM | POA: Diagnosis not present

## 2017-12-30 DIAGNOSIS — R0789 Other chest pain: Secondary | ICD-10-CM | POA: Diagnosis present

## 2017-12-30 DIAGNOSIS — Y999 Unspecified external cause status: Secondary | ICD-10-CM | POA: Diagnosis not present

## 2017-12-30 LAB — POC URINE PREG, ED: Preg Test, Ur: NEGATIVE

## 2017-12-30 MED ORDER — IBUPROFEN 800 MG PO TABS: 800.0000 mg | ORAL_TABLET | Freq: Three times a day (TID) | ORAL | 0 refills | Status: DC | PRN

## 2017-12-30 MED ORDER — METHOCARBAMOL 500 MG PO TABS: 500.0000 mg | ORAL_TABLET | Freq: Four times a day (QID) | ORAL | 0 refills | Status: DC | PRN
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
## 2017-12-30 NOTE — ED Notes (Signed)
Bed: [facility] Expected date:  Expected time:  Means of arrival:  Comments: 

## 2017-12-30 NOTE — Discharge Instructions (Signed)
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
## 2018-04-17 ENCOUNTER — Encounter: Payer: Self-pay | Admitting: Obstetrics and Gynecology

## 2018-04-17 ENCOUNTER — Ambulatory Visit (INDEPENDENT_AMBULATORY_CARE_PROVIDER_SITE_OTHER): Payer: BLUE CROSS/BLUE SHIELD | Admitting: Obstetrics and Gynecology

## 2018-04-17 VITALS — BP 111/74 | HR 76 | Ht 60.0 in | Wt 100.0 lb

## 2018-04-17 DIAGNOSIS — Z3169 Encounter for other general counseling and advice on procreation: Secondary | ICD-10-CM

## 2018-04-17 DIAGNOSIS — Z8742 Personal history of other diseases of the female genital tract: Secondary | ICD-10-CM

## 2018-04-17 DIAGNOSIS — R87612 Low grade squamous intraepithelial lesion on cytologic smear of cervix (LGSIL): Secondary | ICD-10-CM

## 2018-04-17 NOTE — Progress Notes (Signed)
    GYNECOLOGY PROGRESS NOTE  Subjective:    Patient ID: Grace Torres, female    DOB: 01/10/1991, 27 y.o.   MRN: [id number]  HPI  Patient is a 27 y.o. G0P0000 female who presents for follow up of infertility.  Notes that it has now been over 1 year since attempting to conceive.  Patient denies any risk factors (ecept being told that she had ovarian cysts ~ 2-3 years ago).  Denies h/o pelvic infection or surgeries, medical comorbidities, tobacco abuse.  Her partner is age 26, with one prior child (age 495 years old).  Notes that her partner has taken an OTC semen analysis test which was fine. Patient does not believe that her partner has had a history of infections or trauma. Has been attempting timed coitus each month during fertility window. Cycles are regular, tracking with phone app.   Of note, patient notes that she has not followed up for her abnormal pap smear, but needs to. Had LGSIL pap smear in 05/2017, followed by repeat pap smear 6 months later with same results in 09/2017.   The following portions of the patient's history were reviewed and updated as appropriate: allergies, current medications, past family history, past medical history, past social history, past surgical history and problem list.  Review of Systems Pertinent items noted in HPI and remainder of comprehensive ROS otherwise negative.   Objective:   Blood pressure 111/74, pulse 76, height 5' (1.524 m), weight 100 lb (45.4 kg), last menstrual period 04/10/2018. General appearance: alert and no distress Remainder of exam deferred.    Assessment:   Infertility (primary) History of ovarian cysts Abnormal pap smear  Plan:   - Will order pelvic ultrasound to assess pelvic anatomy and rule out conditions such as PCOS. Also recommend that her partner have an official semen analysis.  Also encouraged patient to perform ovulation testing (either at home, or can have Day#21 progesteron e levels drawn).  No indication for HSG  at this time, but can consider in the future if warranted.  - Patient to return in 2-3 weeks, can discuss ultrasound results, and begin on Clomid if normal.  Also will perform colposcopy at that visit.    Hildred Laserherry, Shaine Newmark, MD [facility]

## 2018-04-17 NOTE — Progress Notes (Signed)
Pt is present today due to fertility issues. Pt stated that she has been trying ot get pregnant for over a year and has not gotten pregnant yet.

## 2018-04-21 ENCOUNTER — Encounter: Payer: Self-pay | Admitting: Obstetrics and Gynecology

## 2018-05-04 ENCOUNTER — Telehealth: Payer: Self-pay | Admitting: *Deleted

## 2018-05-04 NOTE — Progress Notes (Signed)
Patient is seeing GYN. Grace Torres

## 2018-05-04 NOTE — Telephone Encounter (Signed)
I left a message for the pt to return my call.  CRM also created. 

## 2018-05-04 NOTE — Telephone Encounter (Signed)
-----   Message from Terressa Koyanagi, DO sent at 07/17/2017  8:38 AM EDT ----- Please check to see if this is still needed. ----- Message ----- From: SYSTEM Sent: 07/16/2017  12:06 AM To: Terressa Koyanagi, DO

## 2018-05-08 ENCOUNTER — Ambulatory Visit (INDEPENDENT_AMBULATORY_CARE_PROVIDER_SITE_OTHER): Payer: BLUE CROSS/BLUE SHIELD

## 2018-05-08 ENCOUNTER — Other Ambulatory Visit (HOSPITAL_COMMUNITY)
Admission: RE | Admit: 2018-05-08 | Discharge: 2018-05-08 | Disposition: A | Discharge status: Home / Self Care | Payer: BLUE CROSS/BLUE SHIELD | Source: Ambulatory Visit | Attending: Obstetrics and Gynecology | Admitting: Obstetrics and Gynecology

## 2018-05-08 ENCOUNTER — Encounter: Payer: Self-pay | Admitting: Obstetrics and Gynecology

## 2018-05-08 ENCOUNTER — Ambulatory Visit (INDEPENDENT_AMBULATORY_CARE_PROVIDER_SITE_OTHER): Payer: BLUE CROSS/BLUE SHIELD | Admitting: Obstetrics and Gynecology

## 2018-05-08 VITALS — BP 111/75 | HR 71 | Ht 60.0 in | Wt 99.1 lb

## 2018-05-08 DIAGNOSIS — N979 Female infertility, unspecified: Secondary | ICD-10-CM | POA: Diagnosis not present

## 2018-05-08 DIAGNOSIS — Z8742 Personal history of other diseases of the female genital tract: Secondary | ICD-10-CM

## 2018-05-08 DIAGNOSIS — R87612 Low grade squamous intraepithelial lesion on cytologic smear of cervix (LGSIL): Secondary | ICD-10-CM | POA: Diagnosis present

## 2018-05-08 DIAGNOSIS — Z3169 Encounter for other general counseling and advice on procreation: Secondary | ICD-10-CM

## 2018-05-08 MED ORDER — CLOMIPHENE CITRATE 50 MG PO TABS: 50.0000 mg | ORAL_TABLET | Freq: Every day | ORAL | 3 refills | Status: DC

## 2018-05-08 NOTE — Progress Notes (Addendum)
    GYNECOLOGY CLINIC COLPOSCOPY PROCEDURE NOTE  28 y.o. G0P0000 here for colposcopy for low-grade squamous intraepithelial neoplasia (LGSIL - encompassing HPV,mild dysplasia,CIN I) pap smear on 09/30/2017. Discussed role for HPV in cervical dysplasia, need for surveillance.  Patient given informed consent, signed copy in the chart, time out was performed.  Placed in lithotomy position. Cervix viewed with speculum and colposcope after application of acetic acid.   Colposcopy adequate? Yes  no mosaicism, no punctation, no abnormal vasculature and acetowhite lesion(s) noted at 6 o'clock; corresponding biopsies obtained.  All specimens were labeled and sent to pathology.   Patient was given post procedure instructions.  Will follow up pathology and manage accordingly; patient will be contacted with results and recommendations.  Routine preventative health maintenance measures emphasized.  Patient also for ultrasound today for further evaluation of infertility. Is to begin Clomid this month as menses started today.    Hildred Laser, MD [facility]

## 2018-05-08 NOTE — Patient Instructions (Signed)
CLOMID PATIENT INSTRUCTIONS  WHY USE IT? Clomid helps your ovaries to release eggs (ovulate).  HOW TO USE IT? Clomid is taken as a pill usually on days 5,6,7,8, & 9 of your cycle.  Day 1 is the first day of your period. The dose or duration may be changed to achieve ovulation.  Provera (progesterone) may first be used to bring on a period for some patients.  If you do not get pregnant this cycle, for your next cycles, take on days 1, 2, 3, 4 and 5.  If you do not get a period, take Provera 10 mg daily for 10 days to bring on a period; the first day you get bleeding is Day 1 of your cycle.  The day of ovulation on Clomid is usually between cycle day 14 and 17.  Having sexual intercourse at least every other day between cycle day 13 and 18 will improve your chances of becoming pregnant during the Clomid cycle.  You may monitor your ovulation using basal body temperature charts or with ovulation kits.  If using the ovulation predictor kits, having intercourse the day of the surge and the two days following is recommended. If you get your period, call when it starts for an appointment with your doctor, so that an exam may be done, and another Clomid cycle can be considered if appropriate. If you do not get a period by day 35 of the cycle, please get a blood pregnancy test.  If it is negative, speak to your doctor for instructions to bring on another period and to plan a follow-up appointment.  THINGS TO KNOW: If you get pregnant while using Clomid, your chance of twins is 7% and triplets is less than 1%. Some studies have suggested the use of "fertility drugs" may increase your risk of ovarian cancers in the future.  It is unclear if these drugs increase the risk, or people who have problems with fertility are prone for these cancers.  If there is an actual risk, it is very low.  If you have a history of liver problems or ovarian cancer, it may be wise to avoid this medication.  SIDE EFFECTS:  The most  common side effect is hot flashes (20%).  Breast tenderness, headaches, nausea, bloating may also occur at different times.  Less than 3/1,000 people have dryness or loss of hair.  Persistent ovarian cysts may form from the use of this medication.  Ovarian hyperstimulation syndrome is a rare side effect at low doses.  Visual changes like flashes of light or blurring.          Clomiphene tablets What is this medicine? CLOMIPHENE (KLOE mi feen) is a fertility drug that increases the chance of pregnancy. It helps women ovulate (produce a mature egg) during their cycle. This medicine may be used for other purposes; ask your health care provider or pharmacist if you have questions. COMMON BRAND NAME(S): Clomid, Serophene What should I tell my health care provider before I take this medicine? They need to know if you have any of these conditions: -adrenal gland disease -blood vessel disease or blood clots -cyst on the ovary -endometriosis -liver disease -ovarian cancer -pituitary gland disease -vaginal bleeding that has not been evaluated -an unusual or allergic reaction to clomiphene, other medicines, foods, dyes, or preservatives -pregnant (should not be used if you are already pregnant) -breast-feeding How should I use this medicine? Take this medicine by mouth with a glass of water. Follow the directions on the  prescription label. Take exactly as directed for the exact number of days prescribed. Take your doses at regular intervals. Most women take this medicine for a 5 day period, but the length of treatment may be adjusted. Your doctor will give you a start date for this medication and will give you instructions on proper use. Do not take your medicine more often than directed. Talk to your pediatrician regarding the use of this medicine in children. Special care may be needed. Overdosage: If you think you have taken too much of this medicine contact a poison control center  or emergency room at once. NOTE: This medicine is only for you. Do not share this medicine with others. What if I miss a dose? If you miss a dose, take it as soon as you can. If it is almost time for your next dose, take only that dose. Do not take double or extra doses. What may interact with this medicine? -herbal or dietary supplements, like blue cohosh, black cohosh, chasteberry, or DHEA -prasterone This list may not describe all possible interactions. Give your health care provider a list of all the medicines, herbs, non-prescription drugs, or dietary supplements you use. Also tell them if you smoke, drink alcohol, or use illegal drugs. Some items may interact with your medicine. What should I watch for while using this medicine? Make sure you understand how and when to use this medicine. You need to know when you are ovulating and when to have sexual intercourse. This will increase the chance of a pregnancy. Visit your doctor or health care professional for regular checks on your progress. You may need tests to check the hormone levels in your blood or you may have to use home-urine tests to check for ovulation. Try to keep any appointments. Compared to other fertility treatments, this medicine does not greatly increase your chances of having multiple babies. An increased chance of having twins may occur in roughly 5 out of every 100 women who take this medication. Stop taking this medicine at once and contact your doctor or health care professional if you think you are pregnant. This medicine is not for long-term use. Most women that benefit from this medicine do so within the first three cycles (months). Your doctor or health care professional will monitor your condition. This medicine is usually used for a total of 6 cycles of treatment. You may get drowsy or dizzy. Do not drive, use machinery, or do anything that needs mental alertness until you know how this drug affects you. Do not stand or  sit up quickly. This reduces the risk of dizzy or fainting spells. Drinking alcoholic beverages or smoking tobacco may decrease your chance of becoming pregnant. Limit or stop alcohol and tobacco use during your fertility treatments. What side effects may I notice from receiving this medicine? Side effects that you should report to your doctor or health care professional as soon as possible: -allergic reactions like skin rash, itching or hives, swelling of the face, lips, or tongue -breathing problems -changes in vision -fluid retention -nausea, vomiting -pelvic pain or bloating -severe abdominal pain -sudden weight gain Side effects that usually do not require medical attention (report to your doctor or health care professional if they continue or are bothersome): -breast discomfort -hot flashes -mild pelvic discomfort -mild nausea This list may not describe all possible side effects. Call your doctor for medical advice about side effects. You may report side effects to FDA at 1-800-FDA-1088. Where should I keep my  medicine? Keep out of the reach of children. Store at room temperature between 15 and 30 degrees C (59 and 86 degrees F). Protect from heat, light, and moisture. Throw away any unused medicine after the expiration date. NOTE: This sheet is a summary. It may not cover all possible information. If you have questions about this medicine, talk to your doctor, pharmacist, or health care provider.  2019 Elsevier/Gold Standard (2007-07-30 22:21:06)

## 2018-05-08 NOTE — Progress Notes (Signed)
Pt is present today for colpo. Pt stated that she started her cycle this morning. Pt stated that she is doing well no complaints.

## 2018-05-17 ENCOUNTER — Encounter: Payer: BLUE CROSS/BLUE SHIELD | Admitting: Nurse Practitioner

## 2019-06-26 LAB — OB RESULTS CONSOLE ANTIBODY SCREEN: Antibody Screen: NEGATIVE

## 2019-06-26 LAB — OB RESULTS CONSOLE RUBELLA ANTIBODY, IGM: Rubella: IMMUNE

## 2019-06-26 LAB — OB RESULTS CONSOLE GC/CHLAMYDIA
Chlamydia: NEGATIVE
Gonorrhea: NEGATIVE

## 2019-06-26 LAB — OB RESULTS CONSOLE RPR: RPR: NONREACTIVE

## 2019-06-26 LAB — OB RESULTS CONSOLE HEPATITIS B SURFACE ANTIGEN: Hepatitis B Surface Ag: NEGATIVE

## 2019-06-26 LAB — OB RESULTS CONSOLE ABO/RH: RH Type: POSITIVE

## 2019-06-26 LAB — OB RESULTS CONSOLE HIV ANTIBODY (ROUTINE TESTING): HIV: NONREACTIVE

## 2019-10-01 ENCOUNTER — Other Ambulatory Visit: Payer: Self-pay | Admitting: Obstetrics and Gynecology

## 2019-10-01 DIAGNOSIS — O43102 Malformation of placenta, unspecified, second trimester: Secondary | ICD-10-CM

## 2019-10-11 ENCOUNTER — Encounter: Payer: Self-pay | Admitting: *Deleted

## 2019-10-15 ENCOUNTER — Other Ambulatory Visit: Payer: Self-pay

## 2019-10-15 ENCOUNTER — Ambulatory Visit: Payer: 59 | Attending: Obstetrics and Gynecology

## 2019-10-15 ENCOUNTER — Ambulatory Visit: Payer: 59

## 2019-10-15 ENCOUNTER — Ambulatory Visit: Payer: 59 | Admitting: *Deleted

## 2019-10-15 VITALS — BP 117/79 | HR 98

## 2019-10-15 DIAGNOSIS — Z363 Encounter for antenatal screening for malformations: Secondary | ICD-10-CM | POA: Diagnosis not present

## 2019-10-15 DIAGNOSIS — D649 Anemia, unspecified: Secondary | ICD-10-CM

## 2019-10-15 DIAGNOSIS — O43102 Malformation of placenta, unspecified, second trimester: Secondary | ICD-10-CM

## 2019-10-15 DIAGNOSIS — O99012 Anemia complicating pregnancy, second trimester: Secondary | ICD-10-CM

## 2019-10-15 DIAGNOSIS — Z3A26 26 weeks gestation of pregnancy: Secondary | ICD-10-CM

## 2019-10-17 ENCOUNTER — Other Ambulatory Visit: Payer: Self-pay | Admitting: *Deleted

## 2019-10-17 DIAGNOSIS — Z362 Encounter for other antenatal screening follow-up: Secondary | ICD-10-CM

## 2019-11-26 ENCOUNTER — Other Ambulatory Visit: Payer: Self-pay

## 2019-11-26 ENCOUNTER — Ambulatory Visit: Payer: 59 | Attending: Obstetrics and Gynecology

## 2019-11-26 ENCOUNTER — Other Ambulatory Visit: Payer: Self-pay | Admitting: *Deleted

## 2019-11-26 ENCOUNTER — Ambulatory Visit: Payer: 59 | Admitting: *Deleted

## 2019-11-26 ENCOUNTER — Other Ambulatory Visit: Payer: Self-pay | Admitting: Obstetrics and Gynecology

## 2019-11-26 VITALS — BP 121/77 | HR 94

## 2019-11-26 DIAGNOSIS — D649 Anemia, unspecified: Secondary | ICD-10-CM | POA: Diagnosis not present

## 2019-11-26 DIAGNOSIS — O0990 Supervision of high risk pregnancy, unspecified, unspecified trimester: Secondary | ICD-10-CM | POA: Insufficient documentation

## 2019-11-26 DIAGNOSIS — O43103 Malformation of placenta, unspecified, third trimester: Secondary | ICD-10-CM | POA: Diagnosis not present

## 2019-11-26 DIAGNOSIS — Z3A32 32 weeks gestation of pregnancy: Secondary | ICD-10-CM

## 2019-11-26 DIAGNOSIS — Z362 Encounter for other antenatal screening follow-up: Secondary | ICD-10-CM

## 2019-11-26 DIAGNOSIS — O99013 Anemia complicating pregnancy, third trimester: Secondary | ICD-10-CM

## 2019-11-26 DIAGNOSIS — O365930 Maternal care for other known or suspected poor fetal growth, third trimester, not applicable or unspecified: Secondary | ICD-10-CM

## 2019-11-26 NOTE — Progress Notes (Signed)
Pt reports itching that started around her lips and face about 1 1/2 weeks ago, now having itching on hands, arms, legs, back and soles of feet.

## 2019-12-03 ENCOUNTER — Ambulatory Visit: Payer: 59 | Attending: Obstetrics and Gynecology

## 2019-12-03 ENCOUNTER — Other Ambulatory Visit: Payer: Self-pay

## 2019-12-03 ENCOUNTER — Ambulatory Visit: Payer: 59 | Admitting: *Deleted

## 2019-12-03 DIAGNOSIS — O321XX0 Maternal care for breech presentation, not applicable or unspecified: Secondary | ICD-10-CM | POA: Diagnosis not present

## 2019-12-03 DIAGNOSIS — O99013 Anemia complicating pregnancy, third trimester: Secondary | ICD-10-CM

## 2019-12-03 DIAGNOSIS — O365930 Maternal care for other known or suspected poor fetal growth, third trimester, not applicable or unspecified: Secondary | ICD-10-CM | POA: Diagnosis present

## 2019-12-03 DIAGNOSIS — O43103 Malformation of placenta, unspecified, third trimester: Secondary | ICD-10-CM

## 2019-12-03 DIAGNOSIS — Z3A33 33 weeks gestation of pregnancy: Secondary | ICD-10-CM

## 2019-12-11 ENCOUNTER — Ambulatory Visit: Payer: 59 | Attending: Obstetrics and Gynecology

## 2019-12-11 ENCOUNTER — Other Ambulatory Visit: Payer: Self-pay

## 2019-12-11 ENCOUNTER — Ambulatory Visit: Payer: 59

## 2019-12-11 DIAGNOSIS — O99013 Anemia complicating pregnancy, third trimester: Secondary | ICD-10-CM | POA: Diagnosis not present

## 2019-12-11 DIAGNOSIS — Z3A34 34 weeks gestation of pregnancy: Secondary | ICD-10-CM

## 2019-12-11 DIAGNOSIS — D649 Anemia, unspecified: Secondary | ICD-10-CM | POA: Diagnosis not present

## 2019-12-11 DIAGNOSIS — O365930 Maternal care for other known or suspected poor fetal growth, third trimester, not applicable or unspecified: Secondary | ICD-10-CM

## 2019-12-11 DIAGNOSIS — O43103 Malformation of placenta, unspecified, third trimester: Secondary | ICD-10-CM

## 2019-12-18 ENCOUNTER — Ambulatory Visit: Payer: 59 | Admitting: *Deleted

## 2019-12-18 ENCOUNTER — Ambulatory Visit: Payer: 59 | Attending: Obstetrics and Gynecology

## 2019-12-18 ENCOUNTER — Other Ambulatory Visit: Payer: Self-pay | Admitting: *Deleted

## 2019-12-18 ENCOUNTER — Other Ambulatory Visit: Payer: Self-pay

## 2019-12-18 DIAGNOSIS — O43103 Malformation of placenta, unspecified, third trimester: Secondary | ICD-10-CM | POA: Diagnosis not present

## 2019-12-18 DIAGNOSIS — D649 Anemia, unspecified: Secondary | ICD-10-CM | POA: Diagnosis not present

## 2019-12-18 DIAGNOSIS — Z3A35 35 weeks gestation of pregnancy: Secondary | ICD-10-CM

## 2019-12-18 DIAGNOSIS — O99013 Anemia complicating pregnancy, third trimester: Secondary | ICD-10-CM | POA: Diagnosis not present

## 2019-12-18 DIAGNOSIS — O365930 Maternal care for other known or suspected poor fetal growth, third trimester, not applicable or unspecified: Secondary | ICD-10-CM | POA: Diagnosis present

## 2019-12-18 LAB — OB RESULTS CONSOLE GBS: GBS: NEGATIVE

## 2020-01-09 ENCOUNTER — Other Ambulatory Visit: Payer: Self-pay

## 2020-01-09 ENCOUNTER — Ambulatory Visit: Payer: 59 | Admitting: *Deleted

## 2020-01-09 ENCOUNTER — Encounter (HOSPITAL_COMMUNITY): Payer: Self-pay

## 2020-01-09 ENCOUNTER — Ambulatory Visit: Payer: 59 | Attending: Obstetrics and Gynecology

## 2020-01-09 DIAGNOSIS — O99013 Anemia complicating pregnancy, third trimester: Secondary | ICD-10-CM | POA: Diagnosis not present

## 2020-01-09 DIAGNOSIS — Z3A38 38 weeks gestation of pregnancy: Secondary | ICD-10-CM

## 2020-01-09 DIAGNOSIS — O321XX0 Maternal care for breech presentation, not applicable or unspecified: Secondary | ICD-10-CM | POA: Diagnosis not present

## 2020-01-09 DIAGNOSIS — O365930 Maternal care for other known or suspected poor fetal growth, third trimester, not applicable or unspecified: Secondary | ICD-10-CM | POA: Diagnosis not present

## 2020-01-09 NOTE — Patient Instructions (Addendum)
[name]  01/09/2020   Your procedure is scheduled on:  01/15/2020  Arrive at 0830 at [employer] [facility] on [address] at [facility]  and [employer]. You are invited to use the FREE valet parking or use the Visitor's parking deck.  Pick up the phone at the desk and dial [phone].  Call this number if you have problems the morning of surgery: [phone]  Remember:   Do not eat food:(After Midnight) Desps de medianoche.  Do not drink clear liquids: (After Midnight) Desps de medianoche.  Take these medicines the morning of surgery with A SIP OF WATER:  none   Do not wear jewelry, make-up or nail polish.  Do not wear lotions, powders, or perfumes. Do not wear deodorant.  Do not shave 48 hours prior to surgery.  Do not bring valuables to the hospital.  [facility] is not   responsible for any belongings or valuables brought to the hospital.  Contacts, dentures or bridgework may not be worn into surgery.  Leave suitcase in the car. After surgery it may be brought to your room.  For patients admitted to the hospital, checkout time is 11:00 AM the day of              discharge.      Please read over the following fact sheets that you were given:     Preparing for Surgery

## 2020-01-13 ENCOUNTER — Other Ambulatory Visit: Payer: Self-pay

## 2020-01-13 ENCOUNTER — Other Ambulatory Visit (HOSPITAL_COMMUNITY)
Admission: RE | Admit: 2020-01-13 | Discharge: 2020-01-13 | Disposition: A | Discharge status: Home / Self Care | Payer: 59 | Source: Ambulatory Visit | Attending: Obstetrics and Gynecology | Admitting: Obstetrics and Gynecology

## 2020-01-13 DIAGNOSIS — Z20822 Contact with and (suspected) exposure to covid-19: Secondary | ICD-10-CM | POA: Insufficient documentation

## 2020-01-13 HISTORY — DX: Unspecified abnormal cytological findings in specimens from vagina: R87.629

## 2020-01-13 LAB — CBC
HCT: 37.4 % (ref 36.0–46.0)
Hemoglobin: 11.4 g/dL — ABNORMAL LOW (ref 12.0–15.0)
MCH: 21.9 pg — ABNORMAL LOW (ref 26.0–34.0)
MCHC: 30.5 g/dL (ref 30.0–36.0)
MCV: 71.8 fL — ABNORMAL LOW (ref 80.0–100.0)
Platelets: 300 10*3/uL (ref 150–400)
RBC: 5.21 MIL/uL — ABNORMAL HIGH (ref 3.87–5.11)
RDW: 13.7 % (ref 11.5–15.5)
WBC: 10.2 10*3/uL (ref 4.0–10.5)
nRBC: 0 % (ref 0.0–0.2)

## 2020-01-13 LAB — SARS CORONAVIRUS 2 BY RT PCR (HOSPITAL ORDER, PERFORMED IN ~~LOC~~ HOSPITAL LAB): SARS Coronavirus 2: NEGATIVE

## 2020-01-13 LAB — TYPE AND SCREEN
ABO/RH(D): O POS
Antibody Screen: NEGATIVE

## 2020-01-13 LAB — RPR: RPR Ser Ql: NONREACTIVE

## 2020-01-13 NOTE — MAU Note (Signed)
Pt denies Covid symptoms.  Tolerated test.
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
## 2020-01-15 ENCOUNTER — Inpatient Hospital Stay (HOSPITAL_COMMUNITY): Payer: 59 | Admitting: Anesthesiology

## 2020-01-15 ENCOUNTER — Other Ambulatory Visit: Payer: Self-pay

## 2020-01-15 ENCOUNTER — Encounter (HOSPITAL_COMMUNITY): Payer: Self-pay | Admitting: Obstetrics and Gynecology

## 2020-01-15 ENCOUNTER — Inpatient Hospital Stay (HOSPITAL_COMMUNITY)
Admission: RE | Admit: 2020-01-15 | Discharge: 2020-01-17 | DRG: 788 | Disposition: A | Discharge status: Home / Self Care | Payer: 59 | Attending: Obstetrics and Gynecology | Admitting: Obstetrics and Gynecology

## 2020-01-15 ENCOUNTER — Encounter (HOSPITAL_COMMUNITY)
Admission: RE | Disposition: A | Discharge status: Home / Self Care | Payer: Self-pay | Source: Home / Self Care | Attending: Obstetrics and Gynecology

## 2020-01-15 DIAGNOSIS — O321XX0 Maternal care for breech presentation, not applicable or unspecified: Secondary | ICD-10-CM | POA: Diagnosis not present

## 2020-01-15 DIAGNOSIS — Z23 Encounter for immunization: Secondary | ICD-10-CM | POA: Diagnosis not present

## 2020-01-15 DIAGNOSIS — Z20822 Contact with and (suspected) exposure to covid-19: Secondary | ICD-10-CM | POA: Diagnosis present

## 2020-01-15 DIAGNOSIS — Z3A39 39 weeks gestation of pregnancy: Secondary | ICD-10-CM | POA: Diagnosis not present

## 2020-01-15 SURGERY — Surgical Case
Anesthesia: Spinal | Wound class: Clean Contaminated

## 2020-01-15 MED ORDER — CEFAZOLIN SODIUM-DEXTROSE 2-4 GM/100ML-% IV SOLN: 2.0000 g | INTRAVENOUS | Status: DC

## 2020-01-15 MED ORDER — MENTHOL 3 MG MT LOZG: 1.0000 | LOZENGE | OROMUCOSAL | Status: DC | PRN

## 2020-01-15 MED ORDER — OXYCODONE HCL 5 MG/5ML PO SOLN: 5.0000 mg | Freq: Once | ORAL | Status: DC | PRN

## 2020-01-15 MED ORDER — METOCLOPRAMIDE HCL 5 MG/ML IJ SOLN
INTRAMUSCULAR | Status: AC
  Filled 2020-01-15: qty 2

## 2020-01-15 MED ORDER — NALOXONE HCL 4 MG/10ML IJ SOLN
1.0000 ug/kg/h | INTRAVENOUS | Status: DC | PRN
  Filled 2020-01-15: qty 5

## 2020-01-15 MED ORDER — PHENYLEPHRINE HCL-NACL 20-0.9 MG/250ML-% IV SOLN
INTRAVENOUS | Status: DC | PRN
  Administered 2020-01-15: 60 ug/min via INTRAVENOUS

## 2020-01-15 MED ORDER — SOD CITRATE-CITRIC ACID 500-334 MG/5ML PO SOLN
ORAL | Status: AC
  Filled 2020-01-15: qty 30

## 2020-01-15 MED ORDER — METOCLOPRAMIDE HCL 5 MG/ML IJ SOLN
INTRAMUSCULAR | Status: DC | PRN
  Administered 2020-01-15: 10 mg via INTRAVENOUS

## 2020-01-15 MED ORDER — IBUPROFEN 600 MG PO TABS
600.0000 mg | ORAL_TABLET | Freq: Four times a day (QID) | ORAL | Status: DC | PRN
  Administered 2020-01-15 – 2020-01-17 (×5): 600 mg via ORAL
  Filled 2020-01-15 (×4): qty 1

## 2020-01-15 MED ORDER — PROMETHAZINE HCL 25 MG/ML IJ SOLN: 6.2500 mg | INTRAMUSCULAR | Status: DC | PRN

## 2020-01-15 MED ORDER — OXYTOCIN-SODIUM CHLORIDE 30-0.9 UT/500ML-% IV SOLN
INTRAVENOUS | Status: DC | PRN
  Administered 2020-01-15: 300 mL via INTRAVENOUS

## 2020-01-15 MED ORDER — KETOROLAC TROMETHAMINE 30 MG/ML IJ SOLN
30.0000 mg | Freq: Once | INTRAMUSCULAR | Status: AC | PRN
  Administered 2020-01-15: 30 mg via INTRAVENOUS

## 2020-01-15 MED ORDER — PHENYLEPHRINE HCL (PRESSORS) 10 MG/ML IV SOLN
INTRAVENOUS | Status: DC | PRN
  Administered 2020-01-15: 80 ug via INTRAVENOUS

## 2020-01-15 MED ORDER — OXYTOCIN-SODIUM CHLORIDE 30-0.9 UT/500ML-% IV SOLN: 2.5000 [IU]/h | INTRAVENOUS | Status: AC

## 2020-01-15 MED ORDER — DEXAMETHASONE SODIUM PHOSPHATE 4 MG/ML IJ SOLN
INTRAMUSCULAR | Status: DC | PRN
  Administered 2020-01-15: 4 mg via INTRAVENOUS

## 2020-01-15 MED ORDER — SOD CITRATE-CITRIC ACID 500-334 MG/5ML PO SOLN
30.0000 mL | ORAL | Status: AC
  Administered 2020-01-15: 30 mL via ORAL

## 2020-01-15 MED ORDER — STERILE WATER FOR IRRIGATION IR SOLN
Status: DC | PRN
  Administered 2020-01-15: 1000 mL

## 2020-01-15 MED ORDER — ONDANSETRON HCL 4 MG/2ML IJ SOLN
INTRAMUSCULAR | Status: DC | PRN
  Administered 2020-01-15: 4 mg via INTRAVENOUS

## 2020-01-15 MED ORDER — LACTATED RINGERS IV SOLN: INTRAVENOUS | Status: DC

## 2020-01-15 MED ORDER — NALBUPHINE HCL 10 MG/ML IJ SOLN: 5.0000 mg | Freq: Once | INTRAMUSCULAR | Status: DC | PRN

## 2020-01-15 MED ORDER — DIBUCAINE (PERIANAL) 1 % EX OINT: 1.0000 "application " | TOPICAL_OINTMENT | CUTANEOUS | Status: DC | PRN

## 2020-01-15 MED ORDER — POVIDONE-IODINE 10 % EX SWAB: 2.0000 "application " | Freq: Once | CUTANEOUS | Status: DC

## 2020-01-15 MED ORDER — ACETAMINOPHEN 325 MG PO TABS
650.0000 mg | ORAL_TABLET | ORAL | Status: DC | PRN
  Administered 2020-01-15 – 2020-01-17 (×3): 650 mg via ORAL
  Filled 2020-01-15 (×4): qty 2

## 2020-01-15 MED ORDER — LACTATED RINGERS IV SOLN: INTRAVENOUS | Status: DC | PRN

## 2020-01-15 MED ORDER — FENTANYL CITRATE (PF) 100 MCG/2ML IJ SOLN
INTRAMUSCULAR | Status: AC
  Filled 2020-01-15: qty 2

## 2020-01-15 MED ORDER — ZOLPIDEM TARTRATE 5 MG PO TABS: 5.0000 mg | ORAL_TABLET | Freq: Every evening | ORAL | Status: DC | PRN

## 2020-01-15 MED ORDER — ONDANSETRON HCL 4 MG/2ML IJ SOLN: 4.0000 mg | Freq: Three times a day (TID) | INTRAMUSCULAR | Status: DC | PRN

## 2020-01-15 MED ORDER — MORPHINE SULFATE (PF) 0.5 MG/ML IJ SOLN
INTRAMUSCULAR | Status: DC | PRN
Start: 2020-01-15 — End: 2020-01-15
  Administered 2020-01-15: 150 ug via INTRATHECAL

## 2020-01-15 MED ORDER — SCOPOLAMINE 1 MG/3DAYS TD PT72
1.0000 | MEDICATED_PATCH | Freq: Once | TRANSDERMAL | Status: DC
  Administered 2020-01-15: 1.5 mg via TRANSDERMAL

## 2020-01-15 MED ORDER — SODIUM CHLORIDE 0.9 % IR SOLN
Status: DC | PRN
  Administered 2020-01-15: 1000 mL

## 2020-01-15 MED ORDER — DIPHENHYDRAMINE HCL 25 MG PO CAPS: 25.0000 mg | ORAL_CAPSULE | Freq: Four times a day (QID) | ORAL | Status: DC | PRN

## 2020-01-15 MED ORDER — CEFAZOLIN SODIUM-DEXTROSE 2-3 GM-%(50ML) IV SOLR
INTRAVENOUS | Status: DC | PRN
  Administered 2020-01-15: 2 g via INTRAVENOUS

## 2020-01-15 MED ORDER — BUPIVACAINE IN DEXTROSE 0.75-8.25 % IT SOLN
INTRATHECAL | Status: DC | PRN
  Administered 2020-01-15: 1.5 mL via INTRATHECAL

## 2020-01-15 MED ORDER — PRENATAL MULTIVITAMIN CH
1.0000 | ORAL_TABLET | Freq: Every day | ORAL | Status: DC
  Administered 2020-01-16 – 2020-01-17 (×2): 1 via ORAL
  Filled 2020-01-15 (×2): qty 1

## 2020-01-15 MED ORDER — PHENYLEPHRINE HCL-NACL 20-0.9 MG/250ML-% IV SOLN
INTRAVENOUS | Status: AC
  Filled 2020-01-15: qty 250

## 2020-01-15 MED ORDER — OXYTOCIN-SODIUM CHLORIDE 30-0.9 UT/500ML-% IV SOLN
INTRAVENOUS | Status: AC
  Filled 2020-01-15: qty 500

## 2020-01-15 MED ORDER — OXYCODONE HCL 5 MG PO TABS
5.0000 mg | ORAL_TABLET | ORAL | Status: DC | PRN
  Administered 2020-01-16 (×2): 5 mg via ORAL
  Filled 2020-01-15 (×2): qty 1

## 2020-01-15 MED ORDER — DIPHENHYDRAMINE HCL 50 MG/ML IJ SOLN: 12.5000 mg | INTRAMUSCULAR | Status: DC | PRN

## 2020-01-15 MED ORDER — KETOROLAC TROMETHAMINE 30 MG/ML IJ SOLN
INTRAMUSCULAR | Status: AC
  Filled 2020-01-15: qty 1

## 2020-01-15 MED ORDER — FENTANYL CITRATE (PF) 100 MCG/2ML IJ SOLN
INTRAMUSCULAR | Status: DC | PRN
Start: 2020-01-15 — End: 2020-01-15
  Administered 2020-01-15: 15 ug via INTRATHECAL

## 2020-01-15 MED ORDER — MORPHINE SULFATE (PF) 0.5 MG/ML IJ SOLN
INTRAMUSCULAR | Status: AC
Start: 2020-01-15 — End: ?
  Filled 2020-01-15: qty 10

## 2020-01-15 MED ORDER — DIPHENHYDRAMINE HCL 25 MG PO CAPS: 25.0000 mg | ORAL_CAPSULE | ORAL | Status: DC | PRN

## 2020-01-15 MED ORDER — PHENYLEPHRINE 40 MCG/ML (10ML) SYRINGE FOR IV PUSH (FOR BLOOD PRESSURE SUPPORT)
PREFILLED_SYRINGE | INTRAVENOUS | Status: AC
  Filled 2020-01-15: qty 10

## 2020-01-15 MED ORDER — TETANUS-DIPHTH-ACELL PERTUSSIS 5-2.5-18.5 LF-MCG/0.5 IM SUSP: 0.5000 mL | Freq: Once | INTRAMUSCULAR | Status: DC

## 2020-01-15 MED ORDER — SIMETHICONE 80 MG PO CHEW
80.0000 mg | CHEWABLE_TABLET | Freq: Three times a day (TID) | ORAL | Status: DC
  Administered 2020-01-16 – 2020-01-17 (×5): 80 mg via ORAL
  Filled 2020-01-15 (×5): qty 1

## 2020-01-15 MED ORDER — SIMETHICONE 80 MG PO CHEW: 80.0000 mg | CHEWABLE_TABLET | ORAL | Status: DC | PRN

## 2020-01-15 MED ORDER — HYDROMORPHONE HCL 1 MG/ML IJ SOLN: 0.2500 mg | INTRAMUSCULAR | Status: DC | PRN

## 2020-01-15 MED ORDER — ONDANSETRON HCL 4 MG/2ML IJ SOLN
INTRAMUSCULAR | Status: AC
  Filled 2020-01-15: qty 2

## 2020-01-15 MED ORDER — WITCH HAZEL-GLYCERIN EX PADS: 1.0000 "application " | MEDICATED_PAD | CUTANEOUS | Status: DC | PRN

## 2020-01-15 MED ORDER — MEPERIDINE HCL 25 MG/ML IJ SOLN: 6.2500 mg | INTRAMUSCULAR | Status: DC | PRN

## 2020-01-15 MED ORDER — SENNOSIDES-DOCUSATE SODIUM 8.6-50 MG PO TABS
2.0000 | ORAL_TABLET | ORAL | Status: DC
  Administered 2020-01-15 – 2020-01-17 (×2): 2 via ORAL
  Filled 2020-01-15 (×2): qty 2

## 2020-01-15 MED ORDER — SIMETHICONE 80 MG PO CHEW
80.0000 mg | CHEWABLE_TABLET | ORAL | Status: DC
  Administered 2020-01-15 – 2020-01-17 (×2): 80 mg via ORAL
  Filled 2020-01-15 (×2): qty 1

## 2020-01-15 MED ORDER — NALBUPHINE HCL 10 MG/ML IJ SOLN: 5.0000 mg | INTRAMUSCULAR | Status: DC | PRN

## 2020-01-15 MED ORDER — INFLUENZA VAC SPLIT QUAD 0.5 ML IM SUSY
0.5000 mL | PREFILLED_SYRINGE | INTRAMUSCULAR | Status: AC
  Administered 2020-01-17: 0.5 mL via INTRAMUSCULAR
  Filled 2020-01-15: qty 0.5

## 2020-01-15 MED ORDER — SODIUM CHLORIDE 0.9% FLUSH: 3.0000 mL | INTRAVENOUS | Status: DC | PRN

## 2020-01-15 MED ORDER — COCONUT OIL OIL: 1.0000 "application " | TOPICAL_OIL | Status: DC | PRN

## 2020-01-15 MED ORDER — CEFAZOLIN SODIUM-DEXTROSE 2-4 GM/100ML-% IV SOLN
INTRAVENOUS | Status: AC
  Filled 2020-01-15: qty 100

## 2020-01-15 MED ORDER — OXYCODONE HCL 5 MG PO TABS: 5.0000 mg | ORAL_TABLET | Freq: Once | ORAL | Status: DC | PRN

## 2020-01-15 MED ORDER — DEXAMETHASONE SODIUM PHOSPHATE 4 MG/ML IJ SOLN
INTRAMUSCULAR | Status: AC
  Filled 2020-01-15: qty 2

## 2020-01-15 MED ORDER — NALOXONE HCL 0.4 MG/ML IJ SOLN: 0.4000 mg | INTRAMUSCULAR | Status: DC | PRN

## 2020-01-15 MED ORDER — HYDROMORPHONE HCL 1 MG/ML IJ SOLN
0.2000 mg | INTRAMUSCULAR | Status: DC | PRN
  Administered 2020-01-15: 0.2 mg via INTRAVENOUS
  Filled 2020-01-15: qty 1

## 2020-01-15 MED ORDER — SCOPOLAMINE 1 MG/3DAYS TD PT72
MEDICATED_PATCH | TRANSDERMAL | Status: AC
  Filled 2020-01-15: qty 1

## 2020-01-15 SURGICAL SUPPLY — 33 items
BENZOIN TINCTURE PRP APPL 2/3 (GAUZE/BANDAGES/DRESSINGS) ×2 IMPLANT
CHLORAPREP W/TINT 26ML (MISCELLANEOUS) ×2 IMPLANT
CLAMP CORD UMBIL (MISCELLANEOUS) IMPLANT
CLOSURE STERI STRIP 1/2 X4 (GAUZE/BANDAGES/DRESSINGS) ×2 IMPLANT
CLOTH BEACON ORANGE TIMEOUT ST (SAFETY) ×2 IMPLANT
DERMABOND ADVANCED (GAUZE/BANDAGES/DRESSINGS)
DERMABOND ADVANCED .7 DNX12 (GAUZE/BANDAGES/DRESSINGS) IMPLANT
DRSG OPSITE POSTOP 4X10 (GAUZE/BANDAGES/DRESSINGS) ×2 IMPLANT
ELECT REM PT RETURN 9FT ADLT (ELECTROSURGICAL) ×2
ELECTRODE REM PT RTRN 9FT ADLT (ELECTROSURGICAL) ×1 IMPLANT
EXTRACTOR VACUUM M CUP 4 TUBE (SUCTIONS) IMPLANT
GLOVE BIO SURGEON STRL SZ7.5 (GLOVE) ×2 IMPLANT
GLOVE BIOGEL PI IND STRL 7.0 (GLOVE) ×1 IMPLANT
GLOVE BIOGEL PI INDICATOR 7.0 (GLOVE) ×1
GOWN STRL REUS W/TWL LRG LVL3 (GOWN DISPOSABLE) ×4 IMPLANT
KIT ABG SYR 3ML LUER SLIP (SYRINGE) ×2 IMPLANT
NEEDLE HYPO 25X5/8 SAFETYGLIDE (NEEDLE) ×2 IMPLANT
NS IRRIG 1000ML POUR BTL (IV SOLUTION) ×2 IMPLANT
PACK C SECTION WH (CUSTOM PROCEDURE TRAY) ×2 IMPLANT
PAD OB MATERNITY 4.3X12.25 (PERSONAL CARE ITEMS) ×2 IMPLANT
PENCIL SMOKE EVAC W/HOLSTER (ELECTROSURGICAL) ×2 IMPLANT
STRIP CLOSURE SKIN 1/2X4 (GAUZE/BANDAGES/DRESSINGS) IMPLANT
SUT MNCRL 0 VIOLET CTX 36 (SUTURE) ×4 IMPLANT
SUT MONOCRYL 0 CTX 36 (SUTURE) ×4
SUT PDS AB 0 CTX 60 (SUTURE) ×2 IMPLANT
SUT PLAIN 0 NONE (SUTURE) IMPLANT
SUT PLAIN 2 0 (SUTURE)
SUT PLAIN 2 0 XLH (SUTURE) IMPLANT
SUT PLAIN ABS 2-0 CT1 27XMFL (SUTURE) IMPLANT
SUT VIC AB 4-0 KS 27 (SUTURE) ×2 IMPLANT
TOWEL OR 17X24 6PK STRL BLUE (TOWEL DISPOSABLE) ×2 IMPLANT
TRAY FOLEY W/BAG SLVR 14FR LF (SET/KITS/TRAYS/PACK) ×2 IMPLANT
WATER STERILE IRR 1000ML POUR (IV SOLUTION) ×2 IMPLANT

## 2020-01-15 NOTE — Progress Notes (Signed)
No change to H&P per patient history BSUS>breech D/W procedure-cesarean section She states she understands and agrees.

## 2020-01-15 NOTE — Brief Op Note (Signed)
01/15/2020  3:25 PM  PATIENT:  Grace Torres  29 y.o. female  PRE-OPERATIVE DIAGNOSIS:  BREECH  POST-OPERATIVE DIAGNOSIS:  BREECH  PROCEDURE:  Procedure(s) with comments: PRIMARY CESAREAN SECTION EDC: 01-20-20 ALLERG: NKDA NEED RNFA (N/A) - Tracey RNFA  SURGEON:  Surgeon(s) and Role:    * Harold Hedge, MD - Primary  PHYSICIAN ASSISTANT:   ASSISTANTS: Mamie Levers RNFA   ANESTHESIA:   spinal  EBL:  Per anesthesiology record   BLOOD ADMINISTERED:none  DRAINS: Urinary Catheter (Foley)   LOCAL MEDICATIONS USED:  NONE  SPECIMEN:  No Specimen  DISPOSITION OF SPECIMEN:  N/A  COUNTS:  YES  TOURNIQUET:  * No tourniquets in log *  DICTATION: .Other Dictation: Dictation Number [id number]  PLAN OF CARE: Admit to inpatient   PATIENT DISPOSITION:  PACU - hemodynamically stable.   Delay start of Pharmacological VTE agent (>24hrs) due to surgical blood loss or risk of bleeding: not applicable

## 2020-01-15 NOTE — Anesthesia Preprocedure Evaluation (Signed)
Anesthesia Evaluation  Patient identified by MRN, date of birth, ID band Patient awake    Reviewed: Allergy & Precautions, NPO status , Patient's Chart, lab work & pertinent test results  Airway Mallampati: II  TM Distance: >3 FB Neck ROM: Full    Dental no notable dental hx.    Pulmonary neg pulmonary ROS,    Pulmonary exam normal breath sounds clear to auscultation       Cardiovascular negative cardio ROS Normal cardiovascular exam Rhythm:Regular Rate:Normal     Neuro/Psych  Headaches, Anxiety Depression negative psych ROS   GI/Hepatic negative GI ROS, Neg liver ROS,   Endo/Other  negative endocrine ROS  Renal/GU negative Renal ROS  negative genitourinary   Musculoskeletal negative musculoskeletal ROS (+)   Abdominal   Peds negative pediatric ROS (+)  Hematology negative hematology ROS (+)   Anesthesia Other Findings   Reproductive/Obstetrics (+) Pregnancy                             Anesthesia Physical Anesthesia Plan  ASA: II  Anesthesia Plan: Spinal   Post-op Pain Management:    Induction:   PONV Risk Score and Plan: 2 and Ondansetron, Midazolam and Treatment may vary due to age or medical condition  Airway Management Planned: Natural Airway  Additional Equipment:   Intra-op Plan:   Post-operative Plan:   Informed Consent: I have reviewed the patients History and Physical, chart, labs and discussed the procedure including the risks, benefits and alternatives for the proposed anesthesia with the patient or authorized representative who has indicated his/her understanding and acceptance.     Dental advisory given  Plan Discussed with: CRNA  Anesthesia Plan Comments:         Anesthesia Quick Evaluation

## 2020-01-15 NOTE — Op Note (Signed)
NAME: Grace Torres, Grace Torres [facility] MEDICAL RECORD [id number] ACCOUNT [id number] DATE OF BIRTH:October 08, 1990 FACILITY: [facility] LOCATION: [facility] PHYSICIAN:Sherion Dooly Jamal Collin II, MD  OPERATIVE REPORT  DATE OF PROCEDURE:  01/15/2020  PREOPERATIVE DIAGNOSIS:  Breech.  POSTOPERATIVE DIAGNOSIS:  Breech.  PROCEDURE:  Low transverse cesarean section.  SURGEON:  Harold Hedge, MD   ASSISTANT:  Mamie Levers, RNFA.  ANESTHESIA:  Spinal.  ESTIMATED BLOOD LOSS:  Per anesthesia note.  FINDINGS:  Viable female infant.  Apgars and birth weight pending.  INDICATIONS AND CONSENT:  This patient is a 29 year old G1 P0 at 39-2/7 weeks with breech baby.  Cesarean section has been discussed.  Potential risks and complications reviewed preoperatively, including but not limited to infection, organ damage,  bleeding requiring transfusion of blood products with HIV and hepatitis acquisition, DVT, PE, pneumonia.  The patient states she understands and agrees and consent is signed on the chart.  DESCRIPTION OF PROCEDURE:  The patient was taken to the operating room where she was identified.  Spinal anesthetic was placed per anesthesiology and she was placed in the dorsal supine position with a 15-degree left lateral wedge.  She was prepped  vaginally with Betadine.  Foley catheter was placed and prepped abdominally with ChloraPrep.  Timeout was undertaken.  After 3 minute drying time, she was draped in a sterile fashion.  After testing for adequate spinal anesthesia, the skin was entered  through a Pfannenstiel incision and dissection was carried out in layers to the peritoneum.  Peritoneum was taken down superiorly and inferiorly and the vesicouterine peritoneum was taken down and the bladder blade was placed.  Uterus was incised in a  low transverse manner and the uterine cavity was entered bluntly with a hemostat.  Clear fluid was noted.  The uterine incision was extended with the fingers.  Baby was delivered from the breech  position without difficulty.  Good cry and tone was noted.   After 1 minute, the cord was clamped and cut.  The baby was handed to waiting pediatrics team.  Placenta was manually delivered.  Cavity was clean.  Uterus was closed in 2 running locking imbricating layers of 0 Monocryl suture, which achieved good  hemostasis.  Tubes and ovaries were normal bilaterally.  Lavage was carried out.  Anterior peritoneum was closed in a running fashion with 0 Monocryl suture, which was also used to reapproximate the pyramidalis muscle in the midline.  The anterior rectus  fascia was closed in a running fashion with a 0 looped PDS.  Skin was closed in a subcuticular fashion with a 4-0 Vicryl on a Keith needle.  Benzoin, Steri-Strips, honeycomb and pressure dressings were applied.  All counts were correct.  The patient was  taken to the recovery room in stable condition.  VN/NUANCE  D:01/15/2020 T:01/15/2020 JOB:[id number]

## 2020-01-15 NOTE — Transfer of Care (Signed)
Immediate Anesthesia Transfer of Care Note  Patient: Grace Torres  Procedure(s) Performed: PRIMARY CESAREAN SECTION EDC: 01-20-20 ALLERG: NKDA NEED RNFA (N/A )  Patient Location: PACU  Anesthesia Type:Spinal  Level of Consciousness: awake, alert  and oriented  Airway & Oxygen Therapy: Patient Spontanous Breathing  Post-op Assessment: Report given to RN and Post -op Vital signs reviewed and stable  Post vital signs: Reviewed and stable  Last Vitals:  Vitals Value Taken Time  BP 109/54 01/15/20 1534  Temp 36.6 C 01/15/20 1534  Pulse 101 01/15/20 1537  Resp 21 01/15/20 1537  SpO2 95 % 01/15/20 1537  Vitals shown include unvalidated device data.  Last Pain:  Vitals:   01/15/20 1534  TempSrc: Oral  PainSc: (P) 0-No pain         Complications: No complications documented.

## 2020-01-15 NOTE — Anesthesia Postprocedure Evaluation (Signed)
Anesthesia Post Note  Patient: Grace Torres  Procedure(s) Performed: PRIMARY CESAREAN SECTION EDC: 01-20-20 ALLERG: NKDA NEED RNFA (N/A )     Patient location during evaluation: PACU Anesthesia Type: Spinal Level of consciousness: awake and alert Pain management: pain level controlled Vital Signs Assessment: post-procedure vital signs reviewed and stable Respiratory status: spontaneous breathing, nonlabored ventilation and respiratory function stable Cardiovascular status: blood pressure returned to baseline and stable Postop Assessment: no apparent nausea or vomiting Anesthetic complications: no   No complications documented.  Last Vitals:  Vitals:   01/15/20 1630 01/15/20 1701  BP: (!) 109/96 127/70  Pulse: 63 77  Resp: 16 20  Temp:  36.6 C  SpO2: 99% 100%    Last Pain:  Vitals:   01/15/20 1701  TempSrc: Oral  PainSc: 0-No pain   Pain Goal:    LLE Motor Response: Purposeful movement (01/15/20 1701) LLE Sensation: Tingling (01/15/20 1701) RLE Motor Response: Purposeful movement (01/15/20 1701) RLE Sensation: Tingling (01/15/20 1701)     Epidural/Spinal Function Cutaneous sensation: Tingles (01/15/20 1701), Patient able to flex knees: Yes (01/15/20 1701), Patient able to lift hips off bed: No (01/15/20 1701), Back pain beyond tenderness at insertion site: No (01/15/20 1701), Progressively worsening motor and/or sensory loss: No (01/15/20 1701), Bowel and/or bladder incontinence post epidural: No (01/15/20 1701)  Lowella Curb

## 2020-01-15 NOTE — Anesthesia Procedure Notes (Signed)
Spinal  Patient location during procedure: OB Start time: 01/15/2020 2:33 PM End time: 01/15/2020 2:38 PM Staffing Performed: anesthesiologist  Anesthesiologist: Lowella Curb, MD Preanesthetic Checklist Completed: patient identified, IV checked, risks and benefits discussed, surgical consent, monitors and equipment checked, pre-op evaluation and timeout performed Spinal Block Patient position: sitting Prep: DuraPrep and site prepped and draped Patient monitoring: heart rate, cardiac monitor, continuous pulse ox and blood pressure Approach: midline Location: L3-4 Injection technique: single-shot Needle Needle type: Pencan  Needle gauge: 24 G Needle length: 10 cm Assessment Sensory level: T4

## 2020-01-16 LAB — CBC
HCT: 27.5 % — ABNORMAL LOW (ref 36.0–46.0)
Hemoglobin: 8.4 g/dL — ABNORMAL LOW (ref 12.0–15.0)
MCH: 21.9 pg — ABNORMAL LOW (ref 26.0–34.0)
MCHC: 30.5 g/dL (ref 30.0–36.0)
MCV: 71.8 fL — ABNORMAL LOW (ref 80.0–100.0)
Platelets: 261 10*3/uL (ref 150–400)
RBC: 3.83 MIL/uL — ABNORMAL LOW (ref 3.87–5.11)
RDW: 13.6 % (ref 11.5–15.5)
WBC: 16 10*3/uL — ABNORMAL HIGH (ref 4.0–10.5)
nRBC: 0 % (ref 0.0–0.2)

## 2020-01-16 LAB — BIRTH TISSUE RECOVERY COLLECTION (PLACENTA DONATION)

## 2020-01-16 MED ORDER — HYDROMORPHONE HCL 1 MG/ML IJ SOLN
2.0000 mg | Freq: Once | INTRAMUSCULAR | Status: AC
  Administered 2020-01-16: 2 mg via INTRAMUSCULAR
  Filled 2020-01-16 (×2): qty 2

## 2020-01-16 MED ORDER — TRAMADOL HCL 50 MG PO TABS
50.0000 mg | ORAL_TABLET | Freq: Four times a day (QID) | ORAL | Status: DC
  Administered 2020-01-16 – 2020-01-17 (×4): 50 mg via ORAL
  Filled 2020-01-16 (×4): qty 1

## 2020-01-16 MED ORDER — BISACODYL 10 MG RE SUPP
10.0000 mg | Freq: Once | RECTAL | Status: DC
  Filled 2020-01-16: qty 1

## 2020-01-16 NOTE — Lactation Note (Signed)
This note was copied from a baby's chart. Lactation Consultation Note Baby 14 hrs old. Mom is BF/formula feeding. Mainly formula feeding because she has no milk, per mom. Mom has small tight round breast w/small areola's and flat small nipples. Mom's breast tender to touch w/hand expression.  Asked mom if she has her bra, mom laughed stating she can't wear any of them they are to small. Mom stated her breast has gotten a lot bigger w/pregnancy. Hand pump w/no colostrum noted. Pumping does pull small nipples out a little bit. Breast not compressible. Baby will not be able to latch to breast.  Encouraged pumping for stimulation. Mom shown how to use DEBP & how to disassemble, clean, & reassemble parts. Mom knows to pump q3h for 15-20 min. #21 flanges fitted. Mom encouraged to feed baby 8-12 times/24 hours and with feeding cues. Mom encouraged to waken baby for feedings if hasn't cued in 3 hrs. Mom stated it has been longer than 3 hrs since she last ate.  Mom has no support person to help her at bedside. Encouraged mom to call for staff to help if needs assistance w/baby or feedings.  Fitted mom w/#16 NS d/t such small nipples. Baby has small mouth as well. Demonstrated application. Changed baby's diaper, had voided.  Discussed positioning, support, and comfort during feedings.  Latched baby in football position. Baby just held nipple shield in mouth. One suck once in a while. Inserted formula into NS w/curve tip syring. Didn't really stimulate baby to feed. Baby needed much stimulation to suck on NS. Baby didn't take all what was in NS. Baby given formula w/slow flow green top nipple. Baby took 10 ml.  Newborn behavior, feeding habits, STS, I&O, breast massage, milk storage, supply and demand discussed. Mom states she is going to give formula as well as BM. Encouraged to put baby to breast first.  Lactation brochure given. Mom speaks good [place]. Is an interpreter.   Patient Name:  Grace Torres [id number] Date: 01/16/2020 Reason for consult: Initial assessment;Term;Primapara   Maternal Data Has patient been taught Hand Expression?: Yes Does the patient have breastfeeding experience prior to this delivery?: No  Feeding Feeding Type: Formula Nipple Type: Extra Slow Flow  LATCH Score Latch: Too sleepy or reluctant, no latch achieved, no sucking elicited.  Audible Swallowing: None  Type of Nipple: Flat  Comfort (Breast/Nipple): Filling, red/small blisters or bruises, mild/mod discomfort (tender tight breast)  Hold (Positioning): Full assist, staff holds infant at breast  LATCH Score: 2  Interventions Interventions: Breast feeding basics reviewed;Reverse pressure;Assisted with latch;Breast compression;Shells;Skin to skin;Adjust position;Breast massage;Support pillows;DEBP;Hand pump;Position options;Hand express;Pre-pump if needed  Lactation Tools Discussed/Used Tools: Shells;Pump;Flanges;Nipple Shields Nipple shield size: 16 Flange Size: 21 Shell Type: Inverted Breast pump type: Double-Electric Breast Pump WIC Program: No Pump Review: Setup, frequency, and cleaning;Milk Storage Initiated by:: Peri Jefferson RN IBCLC Date initiated:: 01/16/20   Consult Status Consult Status: Follow-up Date: 01/16/20 Follow-up type: In-patient    Charyl Dancer 01/16/2020, 5:31 AM

## 2020-01-16 NOTE — Progress Notes (Signed)
MOB was referred for history of depression/anxiety. * Referral screened out by Clinical Social Worker because none of the following criteria appear to apply: ~ History of anxiety/depression during this pregnancy, or of post-partum depression following prior delivery. ~ Diagnosis of anxiety and/or depression within last 3 years. Per further chart review, MOB diagnosed in 2016 with anxiety and depression.  OR * MOB's symptoms currently being treated with medication and/or therapy.   Please contact the Clinical Social Worker if needs arise, by MOB request, or if MOB scores greater than 9/yes to question 10 on Edinburgh Postpartum Depression Screen.   Grace Torres, MSW, LCSW [facility] at  [phone]   

## 2020-01-16 NOTE — Progress Notes (Signed)
POD # 1  Doing well No complaints.  BP 99/61    Pulse (!) 57    Temp 98.1 F (36.7 C)    Resp 16    Ht 5' (1.524 m)    Wt 61.2 kg    LMP 05/09/2019    SpO2 98%    Breastfeeding Unknown    BMI 26.37 kg/m  Results for orders placed or performed during the hospital encounter of 01/15/20 (from the past 24 hour(s))  Collect bld for placenta donatation     Status: None   Collection Time: 01/16/20  5:27 AM  Result Value Ref Range   Placenta donation bld collect Collected by Laboratory   CBC     Status: Abnormal   Collection Time: 01/16/20  5:27 AM  Result Value Ref Range   WBC 16.0 (H) 4.0 - 10.5 K/uL   RBC 3.83 (L) 3.87 - 5.11 MIL/uL   Hemoglobin 8.4 (L) 12.0 - 15.0 g/dL   HCT 76.1 (L) 36 - 46 %   MCV 71.8 (L) 80.0 - 100.0 fL   MCH 21.9 (L) 26.0 - 34.0 pg   MCHC 30.5 30.0 - 36.0 g/dL   RDW 84.8 59.2 - 76.3 %   Platelets 261 150 - 400 K/uL   nRBC 0.0 0.0 - 0.2 %   Abdomen is soft and non tender Bandage is dry   POD # 1  Doing well Routine care

## 2020-01-16 NOTE — Lactation Note (Signed)
This note was copied from a baby's chart. Lactation Consultation Note  Patient Name: Grace Torres [id number] Date: 01/16/2020 Reason for consult: Follow-up assessment;Primapara;1st time breastfeeding;Term Baby 24hrs old, wt loss 1%, mom sitting in bed tearful, c/o pain to incisional site, baby asleep on back in bassinet. Mom states last latched baby to breast over night but discontinued d/t incisional pain. Reports last pumped yesterday but discontinued d/t nothing coming out of breasts. Mom declined to put baby to breast today, states with pain and will continue to bottle feed. Encouraged mom to pump q3-4hrs, reinforced pump for stimulation at this time, advised to call for [facility] assistance when ready to latch. Mom voiced understanding and continued to cry. Upon leaving Elease Hashimoto, RN in to see mom to assess pain. BGilliam, RN, IBCLC  Maternal Data    Feeding Feeding Type: Bottle Fed - Formula  LATCH Score                   Interventions Interventions: DEBP  Lactation Tools Discussed/Used     Consult Status Consult Status: Follow-up Date: 01/17/20 Follow-up type: In-patient    Charlynn Court 01/16/2020, 3:25 PM

## 2020-01-16 NOTE — Progress Notes (Signed)
Dr Jory Ee was notified patient in a lot of pain. She was told there was moderate blood on dressing. Was told abdomen had a lot of gas and was tight. Patient had previously had Simithicone,Oxycodone and Motrin. Dr. Jory Ee said to start Tramadol every  hours around the clock and to change dressing. Tramadol was given.

## 2020-01-16 NOTE — Progress Notes (Signed)
Patient earlier was screaming in pain. Reports gas pain Now lying in bed smiling after dilaudid   BP (!) 115/59 (BP Location: Right Arm)   Pulse 67   Temp 98.2 F (36.8 C) (Oral)   Resp 18   Ht 5' (1.524 m)   Wt 61.2 kg   LMP 05/09/2019   SpO2 99%   Breastfeeding Unknown   BMI 26.37 kg/m  Results for orders placed or performed during the hospital encounter of 01/15/20 (from the past 24 hour(s))  Collect bld for placenta donatation     Status: None   Collection Time: 01/16/20  5:27 AM  Result Value Ref Range   Placenta donation bld collect Collected by Laboratory   CBC     Status: Abnormal   Collection Time: 01/16/20  5:27 AM  Result Value Ref Range   WBC 16.0 (H) 4.0 - 10.5 K/uL   RBC 3.83 (L) 3.87 - 5.11 MIL/uL   Hemoglobin 8.4 (L) 12.0 - 15.0 g/dL   HCT 25.9 (L) 36 - 46 %   MCV 71.8 (L) 80.0 - 100.0 fL   MCH 21.9 (L) 26.0 - 34.0 pg   MCHC 30.5 30.0 - 36.0 g/dL   RDW 56.3 87.5 - 64.3 %   Platelets 261 150 - 400 K/uL   nRBC 0.0 0.0 - 0.2 %   Abdomen gassy  Bandage is slightly saturated   I believe she is having a lot of gas pain Dulcolax suppository  Change dressing

## 2020-01-17 MED ORDER — OXYCODONE HCL 5 MG PO TABS
5.0000 mg | ORAL_TABLET | ORAL | 0 refills | Status: DC | PRN
Start: 2020-01-17 — End: 2022-11-17

## 2020-01-17 MED ORDER — ACETAMINOPHEN 325 MG PO TABS: 650.0000 mg | ORAL_TABLET | ORAL | 1 refills | Status: AC | PRN

## 2020-01-17 MED ORDER — IBUPROFEN 600 MG PO TABS: 600.0000 mg | ORAL_TABLET | Freq: Four times a day (QID) | ORAL | 0 refills | Status: DC | PRN

## 2020-01-17 NOTE — Discharge Instructions (Signed)
Call MD for T>100.4, heavy vaginal bleeding, severe abdominal pain, intractable nausea and/or vomiting, or respiratory distress.  Call office to schedule postpartum visit in 6 weeks.  Pelvic rest x 6 weeks.  No driving while taking narcotics.   °

## 2020-01-17 NOTE — Lactation Note (Signed)
This note was copied from a baby's chart. Lactation Consultation Note  Patient Name: Grace Torres Today's Date: 01/17/2020   Spoke to [employer] and she reported to [facility] that mom has switched to formula feeding only. Mom and baby are going home today, baby is at 2% weight loss and on Enfamil formula. LC services no longer needed.  Maternal Data    Feeding Feeding Type: Bottle Fed - Formula Nipple Type: Slow - flow  LATCH Score                   Interventions    Lactation Tools Discussed/Used     Consult Status      Grace Torres 01/17/2020, 10:35 AM

## 2020-01-17 NOTE — Discharge Summary (Signed)
Postpartum Discharge Summary     Patient Name: Grace Torres DOB: 1991-04-13 MRN: [id number]  Date of admission: 01/15/2020 Delivery date:01/15/2020  Delivering provider: Everlene Farrier  Date of discharge: 01/17/2020  Admitting diagnosis: Breech presentation [O32.1XX0] Cesarean delivery delivered [O82] Intrauterine pregnancy: [redacted]w[redacted]d    Secondary diagnosis:  Active Problems:   Breech presentation   Cesarean delivery delivered  Additional problems: none    Discharge diagnosis: Term Pregnancy Delivered                                              Post partum procedures:none Augmentation: N/A Complications: None  Hospital course: Sceduled C/S   28y.o. yo G1P1001 at 331w2das admitted to the hospital 01/15/2020 for scheduled cesarean section with the following indication:Malpresentation.Delivery details are as follows:  Membrane Rupture Time/Date: 2:55 PM ,01/15/2020   Delivery Method:C-Section, Low Transverse  Details of operation can be found in separate operative note.  Patient had an uncomplicated postpartum course.  She is ambulating, tolerating a regular diet, passing flatus, and urinating well. Patient is discharged home in stable condition on  01/17/20        Newborn Data: Birth date:01/15/2020  Birth time:2:56 PM  Gender:Female  Living status:Living  Apgars:9 ,9  Weight:3235 g     Magnesium Sulfate received: No BMZ received: No Rhophylac:No MMR:No T-DaP:Given prenatally Flu: No Transfusion:No  Physical exam  Vitals:   01/16/20 0835 01/16/20 1218 01/16/20 2218 01/17/20 0508  BP: 111/61 (!) 115/59 113/72 113/70  Pulse: (!) 55 67 82 64  Resp: _0 Temp: 97.7 F (36.5 C) 98.2 F (36.8 C) 98.3 F (36.8 C) 98.2 F (36.8 C)  TempSrc: Oral Oral Oral Oral  SpO2:  99%  99%  Weight:      Height:       General: alert, cooperative and no distress Lochia: appropriate Uterine Fundus: firm Incision: Healing well with no significant drainage, No significant  erythema DVT Evaluation: No evidence of DVT seen on physical exam. Negative Homan's sign. No cords or calf tenderness. Labs: Lab Results  Component Value Date   WBC 16.0 (H) 01/16/2020   HGB 8.4 (L) 01/16/2020   HCT 27.5 (L) 01/16/2020   MCV 71.8 (L) 01/16/2020   PLT 261 01/16/2020   CMP Latest Ref Rng & Units 05/16/2017  Glucose 65 - 99 mg/dL 78  BUN 7 - 25 mg/dL 9  Creatinine 0.50 - 1.10 mg/dL 0.77  Sodium 135 - 146 mmol/L 138  Potassium 3.5 - 5.3 mmol/L 4.1  Chloride 98 - 110 mmol/L 105  CO2 20 - 32 mmol/L 25  Calcium 8.6 - 10.2 mg/dL 9.3  Total Protein 6.1 - 8.1 g/dL 7.9  Total Bilirubin 0.2 - 1.2 mg/dL 0.2  Alkaline Phos 39 - 117 U/L -  AST 10 - 30 U/L 20  ALT 6 - 29 U/L 17   Edinburgh Score: Edinburgh Postnatal Depression Scale Screening Tool 01/16/2020  I have been able to laugh and see the funny side of things. 0  I have looked forward with enjoyment to things. 0  I have blamed myself unnecessarily when things went wrong. 1  I have been anxious or worried for no good reason. 1  I have felt scared or panicky for no good reason. 0  Things have been getting on top of me. 1  I have been so unhappy that I have had difficulty sleeping. 0  I have felt sad or miserable. 0  I have been so unhappy that I have been crying. 0  The thought of harming myself has occurred to me. 0  Edinburgh Postnatal Depression Scale Total 3     After visit meds:  Allergies as of 01/17/2020   No Known Allergies     Medication List    TAKE these medications   acetaminophen 325 MG tablet Commonly known as: TYLENOL Take 2 tablets (650 mg total) by mouth every 4 (four) hours as needed for mild pain (temperature > 101.5.).   ibuprofen 600 MG tablet Commonly known as: ADVIL Take 1 tablet (600 mg total) by mouth every 6 (six) hours as needed for moderate pain.   oxyCODONE 5 MG immediate release tablet Commonly known as: Oxy IR/ROXICODONE Take 1 tablet (5 mg total) by mouth every 4 (four)  hours as needed for moderate pain.        Discharge home in stable condition Infant Feeding: Breast Infant Disposition:home with mother Discharge instruction: per After Visit Summary and Postpartum booklet. Activity: Advance as tolerated. Pelvic rest for 6 weeks.  Diet: routine diet Future Appointments:No future appointments. Follow up Visit:  PPV in 6 weeks     01/17/2020 Linda Hedges, DO

## 2020-01-17 NOTE — Progress Notes (Signed)
Subjective: Postpartum Day 2: Cesarean Delivery Patient reports tolerating PO, + flatus and no problems voiding.  Pain improved.  Objective: Vital signs in last 24 hours: Temp:  [97.7 F (36.5 C)-98.3 F (36.8 C)] 98.2 F (36.8 C) (09/17 0508) Pulse Rate:  [55-82] 64 (09/17 0508) Resp:  [16-18] 16 (09/17 0508) BP: (111-115)/(59-72) 113/70 (09/17 0508) SpO2:  [99 %] 99 % (09/17 0508)  Physical Exam:  General: alert, cooperative and appears stated age Lochia: appropriate Uterine Fundus: firm Incision: healing well, no significant drainage, poor seal on honeycomb dressing DVT Evaluation: No evidence of DVT seen on physical exam. Negative Homan's sign. No cords or calf tenderness.  Recent Labs    01/16/20 0527  HGB 8.4*  HCT 27.5*    Assessment/Plan: Status post Cesarean section. Doing well postoperatively.  Discharge home with standard precautions and return to clinic in 4-6 weeks. Replace honeycomb  Mitchel Honour 01/17/2020, 8:20 AM

## 2020-01-20 ENCOUNTER — Inpatient Hospital Stay (HOSPITAL_COMMUNITY): Admit: 2020-01-20 | Payer: Self-pay
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
## 2021-05-12 DIAGNOSIS — L292 Pruritus vulvae: Secondary | ICD-10-CM | POA: Diagnosis not present

## 2021-05-12 DIAGNOSIS — R300 Dysuria: Secondary | ICD-10-CM | POA: Diagnosis not present

## 2021-05-12 DIAGNOSIS — N760 Acute vaginitis: Secondary | ICD-10-CM | POA: Diagnosis not present

## 2021-05-12 DIAGNOSIS — L282 Other prurigo: Secondary | ICD-10-CM | POA: Diagnosis not present

## 2021-05-12 DIAGNOSIS — Z139 Encounter for screening, unspecified: Secondary | ICD-10-CM | POA: Diagnosis not present

## 2021-05-26 DIAGNOSIS — Z6821 Body mass index (BMI) 21.0-21.9, adult: Secondary | ICD-10-CM | POA: Diagnosis not present

## 2021-05-26 DIAGNOSIS — Z01419 Encounter for gynecological examination (general) (routine) without abnormal findings: Secondary | ICD-10-CM | POA: Diagnosis not present

## 2021-05-26 LAB — HM PAP SMEAR

## 2021-06-22 DIAGNOSIS — L718 Other rosacea: Secondary | ICD-10-CM | POA: Diagnosis not present

## 2021-06-22 DIAGNOSIS — L910 Hypertrophic scar: Secondary | ICD-10-CM | POA: Diagnosis not present

## 2021-06-22 DIAGNOSIS — L700 Acne vulgaris: Secondary | ICD-10-CM | POA: Diagnosis not present

## 2021-07-21 DIAGNOSIS — L508 Other urticaria: Secondary | ICD-10-CM | POA: Diagnosis not present

## 2021-08-09 ENCOUNTER — Ambulatory Visit: Payer: Self-pay | Admitting: Family Medicine

## 2021-08-11 DIAGNOSIS — L910 Hypertrophic scar: Secondary | ICD-10-CM | POA: Diagnosis not present

## 2021-08-11 DIAGNOSIS — L700 Acne vulgaris: Secondary | ICD-10-CM | POA: Diagnosis not present

## 2021-08-11 DIAGNOSIS — L818 Other specified disorders of pigmentation: Secondary | ICD-10-CM | POA: Diagnosis not present

## 2022-04-30 ENCOUNTER — Ambulatory Visit
Admission: EM | Admit: 2022-04-30 | Discharge: 2022-04-30 | Disposition: A | Discharge status: Home / Self Care | Payer: 59

## 2022-04-30 DIAGNOSIS — L282 Other prurigo: Secondary | ICD-10-CM | POA: Diagnosis not present

## 2022-04-30 DIAGNOSIS — L309 Dermatitis, unspecified: Secondary | ICD-10-CM

## 2022-04-30 MED ORDER — PREDNISONE 10 MG (21) PO TBPK: ORAL_TABLET | Freq: Every day | ORAL | 0 refills | Status: DC
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
## 2022-04-30 NOTE — Discharge Instructions (Addendum)
Continue taking the levocetirizine as directed.    Take the prednisone as directed.  Follow up with your primary care provider or dermatologist if your symptoms are not improving.

## 2022-04-30 NOTE — ED Triage Notes (Signed)
Patient to Urgent Care with complaints of rash present to entire body. Reports rash appeared three days ago.   Previously seen at a dermatologist for hives and was prescribed levocetirizine/ lortisone cream. Used these two meds last night which no improvement in symptoms. Reports also having a similar rash when she was a teenager.   Denies any new product/ food usage. No known fevers.

## 2022-05-08 IMAGING — US US MFM FETAL BPP W/O NON-STRESS
1 series · 15 of 26 positions shown · non-contrast
Comparison: none

[Series 1: us mfm fetal bpp w/o non-stress · 26 acquisitions, 15 frames shown]
[im 1/26]
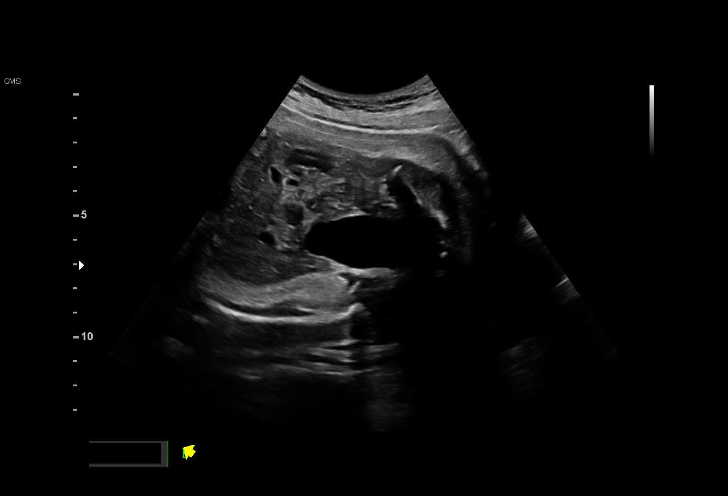
[im 3/26]
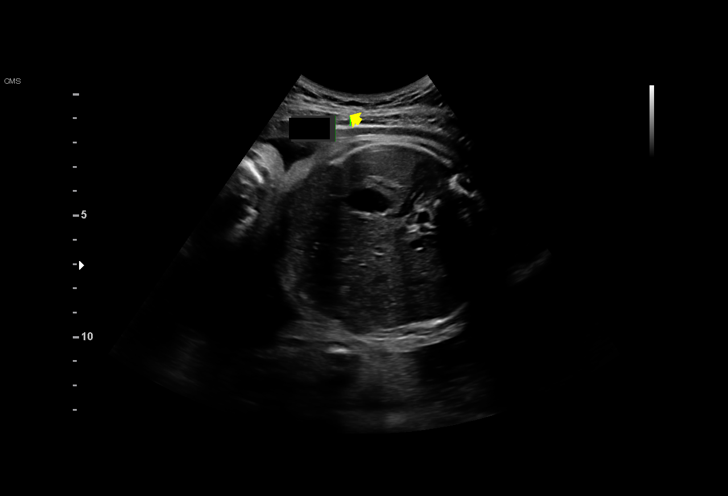
[im 5/26]
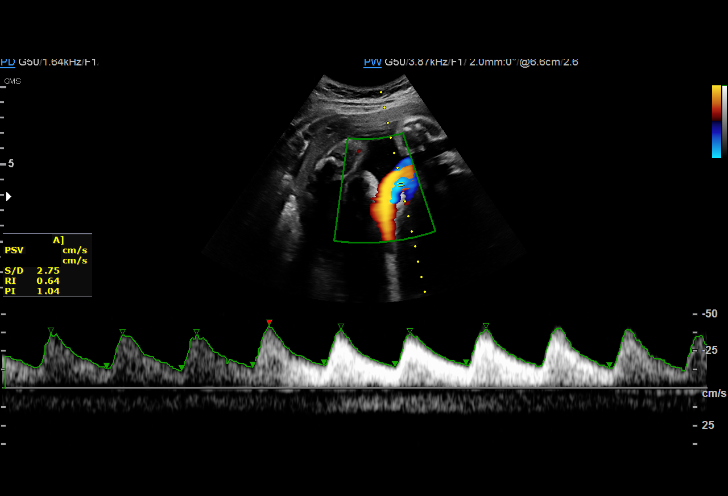
[im 7/26]
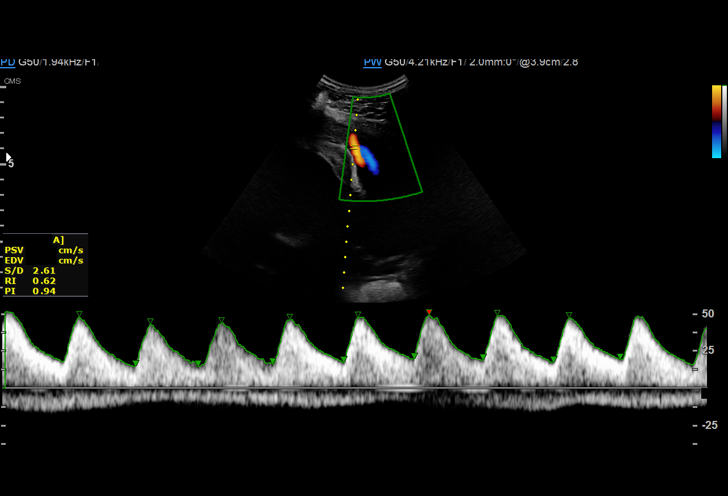
[im 8/26]
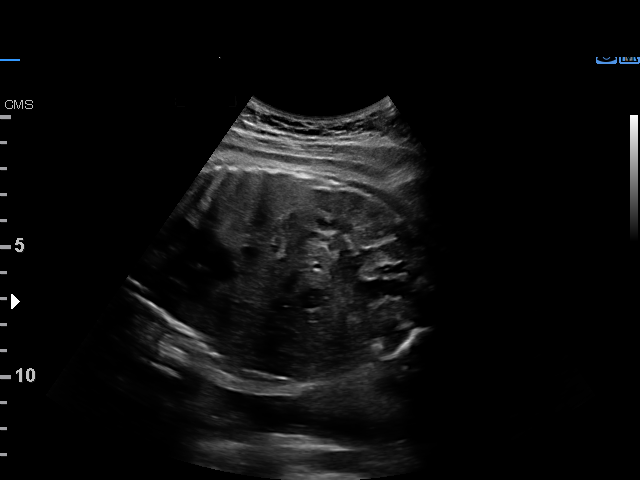
[im 10/26]
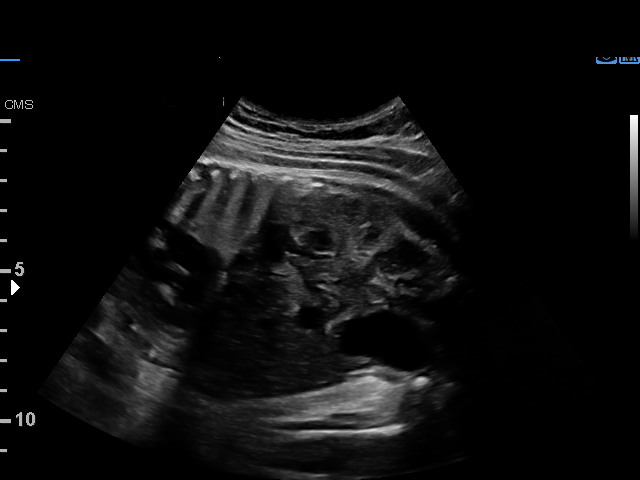
[im 12/26]
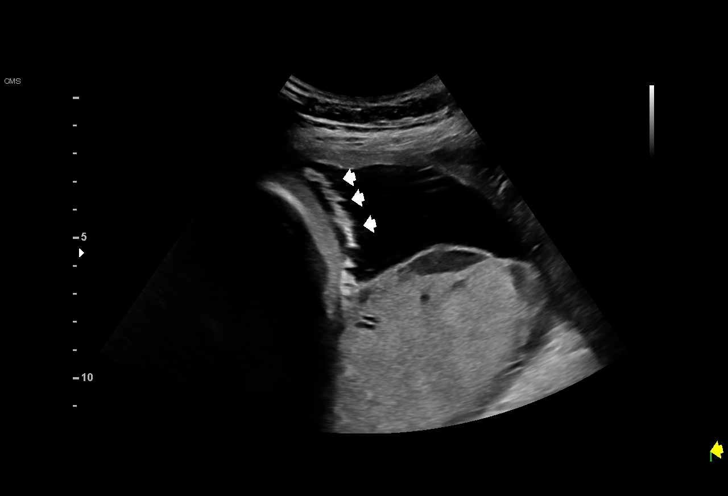
[im 14/26]
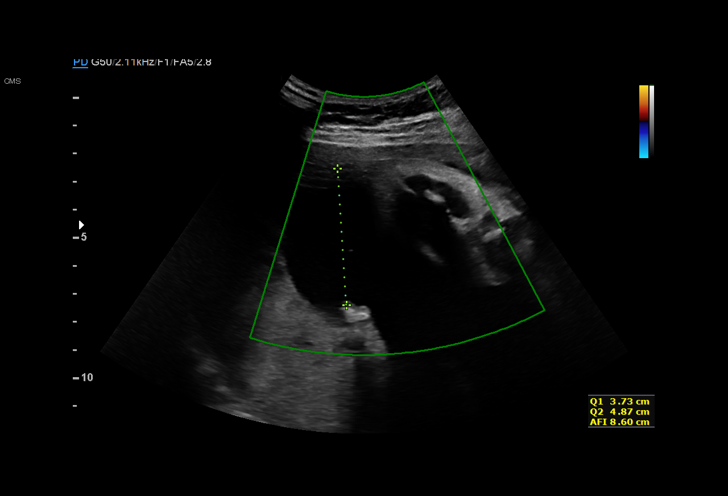
[im 15/26]
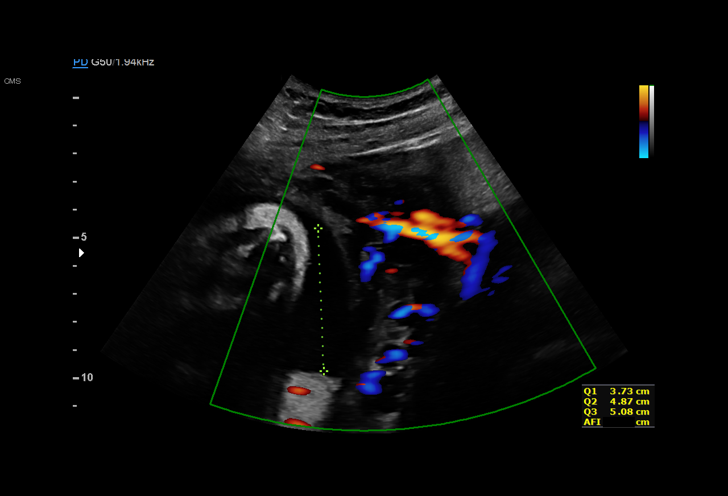
[im 17/26]
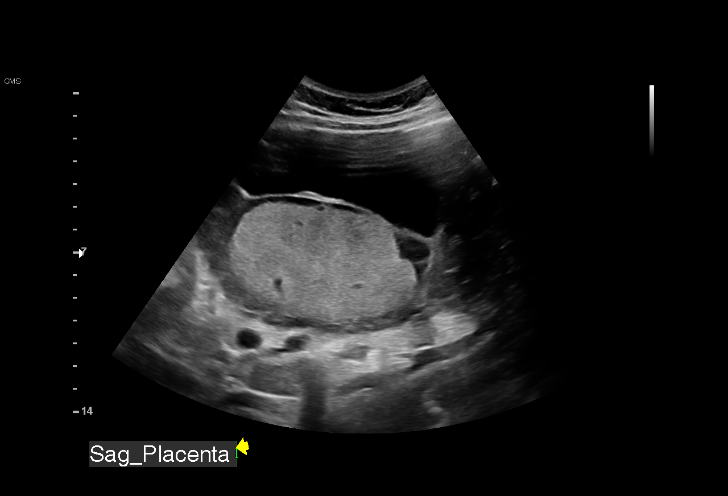
[im 19/26]
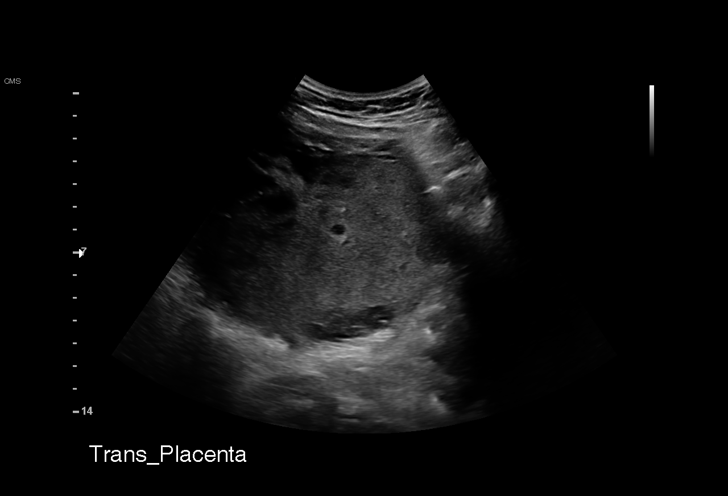
[im 20/26]
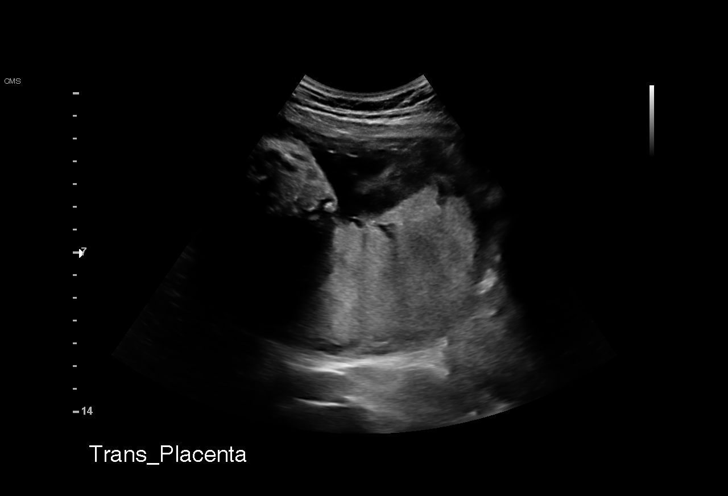
[im 22/26]
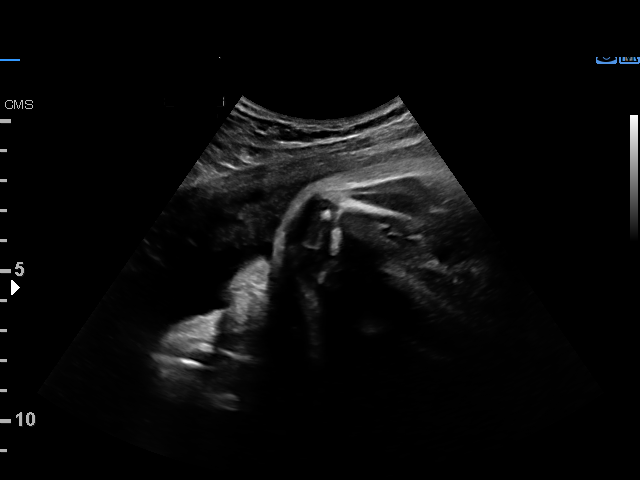
[im 24/26]
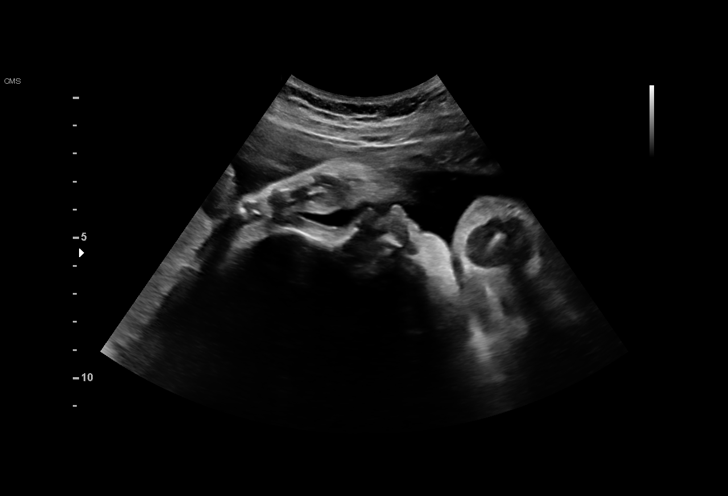
[im 26/26]
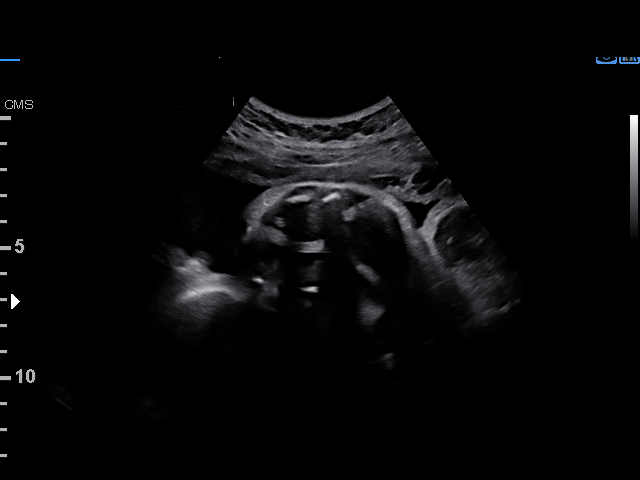

[15 of 26 positions shown; findings below may reference images not displayed]

[REDACTED]

Indications

 Maternal care for known or suspected poor
 fetal growth, third trimester, not applicable or
 unspecified IUGR
 Placental abnormality complicating
 pregnancy, third trimester
 Anemia during pregnancy in third trimester
 34 weeks gestation of pregnancy
Fetal Evaluation

 Num Of Fetuses:         1
 Fetal Heart Rate(bpm):  148
 Cardiac Activity:       Observed
 Presentation:           Cephalic

 Amniotic Fluid
 AFI FV:      Within normal limits

 AFI Sum(cm)     %Tile       Largest Pocket(cm)
 16.4            60

 RUQ(cm)       RLQ(cm)       LUQ(cm)        LLQ(cm)

Biophysical Evaluation

 Amniotic F.V:   Within normal limits       F. Tone:        Observed
 F. Movement:    Observed                   Score:          [DATE]
 F. Breathing:   Observed
OB History

 Blood Type:   O+
 Gravidity:    1         Term:   0        Prem:   0        SAB:   0
 TOP:          0       Ectopic:  0        Living: 0
Gestational Age

 LMP:           32w 3d        Date:  04/28/19                 EDD:   02/02/20
 Best:          34w 2d     Det. By:  Previous Ultrasound      EDD:   01/20/20
                                     (06/20/19)
Doppler - Fetal Vessels

 Umbilical Artery
  S/D     %tile                                              ADFV    RDFV
   2.6       55                                                 No      No

Comments

 This patient was seen due to an IUGR fetus.  She denies any
 problems since her last exam.
 A biophysical profile performed today was [DATE].
 There was normal amniotic fluid noted on today's ultrasound
 exam.
 Doppler studies of the umbilical arteries performed due to
 fetal growth restriction showed a normal S/D ratio of 2.6.
 There were no signs of absent or reversed end-diastolic flow
 noted today.
 A follow-up exam was scheduled in 1 week for another
 biophysical profile and umbilical artery Doppler study.

## 2022-11-17 ENCOUNTER — Ambulatory Visit (INDEPENDENT_AMBULATORY_CARE_PROVIDER_SITE_OTHER): Payer: Self-pay | Admitting: Internal Medicine

## 2022-11-17 ENCOUNTER — Encounter: Payer: Self-pay | Admitting: Internal Medicine

## 2022-11-17 VITALS — BP 112/66 | HR 64 | Temp 96.8°F | Ht 60.0 in | Wt 106.0 lb

## 2022-11-17 DIAGNOSIS — D508 Other iron deficiency anemias: Secondary | ICD-10-CM | POA: Diagnosis not present

## 2022-11-17 DIAGNOSIS — R519 Headache, unspecified: Secondary | ICD-10-CM | POA: Insufficient documentation

## 2022-11-17 DIAGNOSIS — F32A Depression, unspecified: Secondary | ICD-10-CM

## 2022-11-17 DIAGNOSIS — Z1159 Encounter for screening for other viral diseases: Secondary | ICD-10-CM

## 2022-11-17 DIAGNOSIS — F419 Anxiety disorder, unspecified: Secondary | ICD-10-CM

## 2022-11-17 DIAGNOSIS — R7309 Other abnormal glucose: Secondary | ICD-10-CM

## 2022-11-17 DIAGNOSIS — Z136 Encounter for screening for cardiovascular disorders: Secondary | ICD-10-CM

## 2022-11-17 NOTE — Assessment & Plan Note (Signed)
CBC and iron panel today 

## 2022-11-17 NOTE — Assessment & Plan Note (Signed)
Stable off meds We will monitor

## 2022-11-17 NOTE — Patient Instructions (Signed)
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
## 2022-11-17 NOTE — Assessment & Plan Note (Signed)
Okay to continue Tylenol and ibuprofen OTC as needed

## 2022-11-18 LAB — COMPLETE METABOLIC PANEL WITH GFR
AG Ratio: 1.5 (calc) (ref 1.0–2.5)
ALT: 19 U/L (ref 6–29)
AST: 18 U/L (ref 10–30)
Albumin: 4.7 g/dL (ref 3.6–5.1)
Alkaline phosphatase (APISO): 52 U/L (ref 31–125)
BUN: 10 mg/dL (ref 7–25)
CO2: 28 mmol/L (ref 20–32)
Calcium: 9.4 mg/dL (ref 8.6–10.2)
Chloride: 106 mmol/L (ref 98–110)
Creat: 0.74 mg/dL (ref 0.50–0.97)
Globulin: 3.2 g/dL (calc) (ref 1.9–3.7)
Glucose, Bld: 68 mg/dL (ref 65–139)
Potassium: 4.3 mmol/L (ref 3.5–5.3)
Sodium: 139 mmol/L (ref 135–146)
Total Bilirubin: 0.4 mg/dL (ref 0.2–1.2)
Total Protein: 7.9 g/dL (ref 6.1–8.1)
eGFR: 112 mL/min/{1.73_m2} (ref >=60)

## 2022-11-18 LAB — CBC WITH DIFFERENTIAL/PLATELET
Absolute Monocytes: 435 cells/uL (ref 200–950)
Basophils Absolute: 38 cells/uL (ref 0–200)
Basophils Relative: 0.6 %
Eosinophils Absolute: 70 cells/uL (ref 15–500)
Eosinophils Relative: 1.1 %
HCT: 38.4 % (ref 35.0–45.0)
Hemoglobin: 11.4 g/dL — ABNORMAL LOW (ref 11.7–15.5)
Lymphs Abs: 1965 cells/uL (ref 850–3900)
MCH: 21.8 pg — ABNORMAL LOW (ref 27.0–33.0)
MCHC: 29.7 g/dL — ABNORMAL LOW (ref 32.0–36.0)
MCV: 73.4 fL — ABNORMAL LOW (ref 80.0–100.0)
Monocytes Relative: 6.8 %
Neutro Abs: 3891 cells/uL (ref 1500–7800)
Neutrophils Relative %: 60.8 %
Platelets: 407 10*3/uL — ABNORMAL HIGH (ref 140–400)
RBC: 5.23 10*6/uL — ABNORMAL HIGH (ref 3.80–5.10)
RDW: 13.8 % (ref 11.0–15.0)
Total Lymphocyte: 30.7 %
WBC: 6.4 10*3/uL (ref 3.8–10.8)

## 2022-11-18 LAB — HEMOGLOBIN A1C
Hgb A1c MFr Bld: 5.4 % of total Hgb (ref <5.7)
Mean Plasma Glucose: 108 mg/dL
eAG (mmol/L): 6 mmol/L

## 2022-11-18 LAB — LIPID PANEL
Cholesterol: 122 mg/dL (ref <200)
HDL: 40 mg/dL — ABNORMAL LOW (ref >=50)
LDL Cholesterol (Calc): 55 mg/dL (calc)
Non-HDL Cholesterol (Calc): 82 mg/dL (calc) (ref <130)
Total CHOL/HDL Ratio: 3.1 (calc) (ref <5.0)
Triglycerides: 200 mg/dL — ABNORMAL HIGH (ref <150)

## 2022-11-18 LAB — IRON,TIBC AND FERRITIN PANEL
%SAT: 17 % (calc) (ref 16–45)
Ferritin: 53 ng/mL (ref 16–154)
Iron: 54 ug/dL (ref 40–190)
TIBC: 309 mcg/dL (calc) (ref 250–450)

## 2022-11-18 LAB — HEPATITIS C ANTIBODY: Hepatitis C Ab: NONREACTIVE

## 2023-11-24 ENCOUNTER — Encounter: Payer: Self-pay | Admitting: Internal Medicine

## 2023-11-27 ENCOUNTER — Encounter: Payer: Self-pay | Admitting: Internal Medicine

## 2023-11-27 ENCOUNTER — Ambulatory Visit (INDEPENDENT_AMBULATORY_CARE_PROVIDER_SITE_OTHER): Admitting: Internal Medicine

## 2023-11-27 VITALS — BP 118/68 | Ht 60.0 in | Wt 109.4 lb

## 2023-11-27 DIAGNOSIS — E781 Pure hyperglyceridemia: Secondary | ICD-10-CM | POA: Insufficient documentation

## 2023-11-27 DIAGNOSIS — Z0000 Encounter for general adult medical examination without abnormal findings: Secondary | ICD-10-CM | POA: Diagnosis not present

## 2023-11-27 DIAGNOSIS — T7840XA Allergy, unspecified, initial encounter: Secondary | ICD-10-CM

## 2023-11-27 DIAGNOSIS — D508 Other iron deficiency anemias: Secondary | ICD-10-CM

## 2023-11-27 DIAGNOSIS — D75839 Thrombocytosis, unspecified: Secondary | ICD-10-CM | POA: Insufficient documentation

## 2023-11-27 DIAGNOSIS — R739 Hyperglycemia, unspecified: Secondary | ICD-10-CM

## 2023-11-27 MED ORDER — LEVOCETIRIZINE DIHYDROCHLORIDE 5 MG PO TABS: 5.0000 mg | ORAL_TABLET | ORAL | 3 refills | Status: AC | PRN

## 2023-11-27 MED ORDER — CLOTRIMAZOLE-BETAMETHASONE 1-0.05 % EX CREA: TOPICAL_CREAM | CUTANEOUS | 1 refills | Status: AC
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
## 2023-11-27 NOTE — Patient Instructions (Signed)

## 2023-11-28 ENCOUNTER — Ambulatory Visit: Payer: Self-pay | Admitting: Internal Medicine

## 2023-11-28 LAB — HEMOGLOBIN A1C
Hgb A1c MFr Bld: 5.5 % (ref <5.7)
Mean Plasma Glucose: 111 mg/dL
eAG (mmol/L): 6.2 mmol/L

## 2023-11-28 LAB — COMPREHENSIVE METABOLIC PANEL WITH GFR
AG Ratio: 1.4 (calc) (ref 1.0–2.5)
ALT: 26 U/L (ref 6–29)
AST: 19 U/L (ref 10–30)
Albumin: 4.5 g/dL (ref 3.6–5.1)
Alkaline phosphatase (APISO): 47 U/L (ref 31–125)
BUN: 13 mg/dL (ref 7–25)
CO2: 28 mmol/L (ref 20–32)
Calcium: 9.4 mg/dL (ref 8.6–10.2)
Chloride: 104 mmol/L (ref 98–110)
Creat: 0.8 mg/dL (ref 0.50–0.97)
Globulin: 3.3 g/dL (ref 1.9–3.7)
Glucose, Bld: 70 mg/dL (ref 65–139)
Potassium: 4.3 mmol/L (ref 3.5–5.3)
Sodium: 139 mmol/L (ref 135–146)
Total Bilirubin: 0.3 mg/dL (ref 0.2–1.2)
Total Protein: 7.8 g/dL (ref 6.1–8.1)
eGFR: 101 mL/min/1.73m2 (ref >=60)

## 2023-11-28 LAB — IRON,TIBC AND FERRITIN PANEL
%SAT: 20 % (ref 16–45)
Ferritin: 63 ng/mL (ref 16–154)
Iron: 58 ug/dL (ref 40–190)
TIBC: 291 ug/dL (ref 250–450)

## 2023-11-28 LAB — CBC
HCT: 37.4 % (ref 35.0–45.0)
Hemoglobin: 11.2 g/dL — ABNORMAL LOW (ref 11.7–15.5)
MCH: 22.1 pg — ABNORMAL LOW (ref 27.0–33.0)
MCHC: 29.9 g/dL — ABNORMAL LOW (ref 32.0–36.0)
MCV: 73.8 fL — ABNORMAL LOW (ref 80.0–100.0)
Platelets: 403 Thousand/uL — ABNORMAL HIGH (ref 140–400)
RBC: 5.07 Million/uL (ref 3.80–5.10)
RDW: 14.4 % (ref 11.0–15.0)
WBC: 7.4 Thousand/uL (ref 3.8–10.8)

## 2023-11-28 LAB — LIPID PANEL
Cholesterol: 122 mg/dL (ref <200)
HDL: 42 mg/dL — ABNORMAL LOW (ref >=50)
LDL Cholesterol (Calc): 59 mg/dL
Non-HDL Cholesterol (Calc): 80 mg/dL (ref <130)
Total CHOL/HDL Ratio: 2.9 (calc) (ref <5.0)
Triglycerides: 129 mg/dL (ref <150)

## 2024-11-29 ENCOUNTER — Encounter: Payer: Self-pay | Admitting: Internal Medicine
